# Patient Record
Sex: Female | Born: 1960 | Race: White | Hispanic: No | Smoking: Never smoker
Health system: Southern US, Community
[De-identification: ages and names within clinical notes are randomized; demographics above are authoritative.]

## PROBLEM LIST (undated history)

## (undated) HISTORY — PX: TONSILLECTOMY: SUR1361

## (undated) HISTORY — PX: DILATION AND CURETTAGE OF UTERUS: SHX78

---

## 2006-10-31 ENCOUNTER — Ambulatory Visit: Payer: Self-pay | Admitting: Family Medicine

## 2006-10-31 DIAGNOSIS — F419 Anxiety disorder, unspecified: Secondary | ICD-10-CM | POA: Insufficient documentation

## 2006-10-31 DIAGNOSIS — F411 Generalized anxiety disorder: Secondary | ICD-10-CM

## 2006-11-01 ENCOUNTER — Telehealth (INDEPENDENT_AMBULATORY_CARE_PROVIDER_SITE_OTHER): Payer: Self-pay | Admitting: *Deleted

## 2006-11-03 ENCOUNTER — Encounter: Payer: Self-pay | Admitting: Family Medicine

## 2006-11-06 ENCOUNTER — Ambulatory Visit: Payer: Self-pay | Admitting: Family Medicine

## 2006-11-06 DIAGNOSIS — G4700 Insomnia, unspecified: Secondary | ICD-10-CM | POA: Insufficient documentation

## 2006-11-06 DIAGNOSIS — R42 Dizziness and giddiness: Secondary | ICD-10-CM | POA: Insufficient documentation

## 2006-11-07 ENCOUNTER — Telehealth: Payer: Self-pay | Admitting: Family Medicine

## 2006-11-10 ENCOUNTER — Telehealth: Payer: Self-pay | Admitting: Family Medicine

## 2006-11-16 ENCOUNTER — Ambulatory Visit: Payer: Self-pay | Admitting: Family Medicine

## 2006-11-16 DIAGNOSIS — K219 Gastro-esophageal reflux disease without esophagitis: Secondary | ICD-10-CM | POA: Insufficient documentation

## 2006-11-23 ENCOUNTER — Encounter: Payer: Self-pay | Admitting: Family Medicine

## 2006-11-28 ENCOUNTER — Telehealth: Payer: Self-pay | Admitting: Family Medicine

## 2006-12-13 ENCOUNTER — Telehealth (INDEPENDENT_AMBULATORY_CARE_PROVIDER_SITE_OTHER): Payer: Self-pay | Admitting: *Deleted

## 2007-03-14 ENCOUNTER — Telehealth: Payer: Self-pay | Admitting: Family Medicine

## 2007-04-12 ENCOUNTER — Ambulatory Visit: Payer: Self-pay | Admitting: Family Medicine

## 2007-05-15 ENCOUNTER — Ambulatory Visit: Payer: Self-pay | Admitting: Family Medicine

## 2007-05-15 DIAGNOSIS — M25569 Pain in unspecified knee: Secondary | ICD-10-CM | POA: Insufficient documentation

## 2007-06-19 ENCOUNTER — Ambulatory Visit: Payer: Self-pay | Admitting: Family Medicine

## 2007-08-01 ENCOUNTER — Telehealth: Payer: Self-pay | Admitting: Family Medicine

## 2007-08-21 ENCOUNTER — Ambulatory Visit: Payer: Self-pay | Admitting: Family Medicine

## 2008-05-24 ENCOUNTER — Telehealth: Payer: Self-pay | Admitting: Family Medicine

## 2009-09-03 ENCOUNTER — Ambulatory Visit: Payer: Self-pay | Admitting: Family Medicine

## 2009-09-03 DIAGNOSIS — R0789 Other chest pain: Secondary | ICD-10-CM | POA: Insufficient documentation

## 2009-09-06 LAB — CONVERTED CEMR LAB
ALT: 15 units/L (ref 0–35)
AST: 19 units/L (ref 0–37)
Albumin: 4.5 g/dL (ref 3.5–5.2)
Alkaline Phosphatase: 38 units/L — ABNORMAL LOW (ref 39–117)
Glucose, Bld: 93 mg/dL (ref 70–99)
MCHC: 32.8 g/dL (ref 30.0–36.0)
MCV: 88.4 fL (ref 78.0–100.0)
Platelets: 218 10*3/uL (ref 150–400)
Potassium: 4.4 meq/L (ref 3.5–5.3)
RBC: 4.93 M/uL (ref 3.87–5.11)
RDW: 12.3 % (ref 11.5–15.5)
Sodium: 139 meq/L (ref 135–145)
Total Bilirubin: 0.4 mg/dL (ref 0.3–1.2)
Total Protein: 7.1 g/dL (ref 6.0–8.3)

## 2009-09-09 ENCOUNTER — Telehealth: Payer: Self-pay | Admitting: Family Medicine

## 2009-09-17 ENCOUNTER — Ambulatory Visit: Payer: Self-pay | Admitting: Family Medicine

## 2009-09-17 DIAGNOSIS — K589 Irritable bowel syndrome without diarrhea: Secondary | ICD-10-CM | POA: Insufficient documentation

## 2009-09-17 DIAGNOSIS — K582 Mixed irritable bowel syndrome: Secondary | ICD-10-CM | POA: Insufficient documentation

## 2009-09-18 ENCOUNTER — Telehealth: Payer: Self-pay | Admitting: Family Medicine

## 2009-09-23 ENCOUNTER — Encounter: Payer: Self-pay | Admitting: Family Medicine

## 2009-10-08 ENCOUNTER — Telehealth: Payer: Self-pay | Admitting: Family Medicine

## 2010-03-04 ENCOUNTER — Telehealth: Payer: Self-pay | Admitting: Family Medicine

## 2010-03-25 ENCOUNTER — Encounter: Payer: Self-pay | Admitting: Family Medicine

## 2010-04-06 ENCOUNTER — Ambulatory Visit: Payer: Self-pay | Admitting: Family Medicine

## 2010-06-07 ENCOUNTER — Telehealth: Payer: Self-pay | Admitting: Family Medicine

## 2010-08-05 ENCOUNTER — Encounter: Payer: Self-pay | Admitting: Family Medicine

## 2010-08-05 ENCOUNTER — Ambulatory Visit
Admission: RE | Admit: 2010-08-05 | Discharge: 2010-08-05 | Payer: Self-pay | Source: Home / Self Care | Attending: Family Medicine | Admitting: Family Medicine

## 2010-08-10 NOTE — Progress Notes (Signed)
Summary: Needs dexilant rx  Phone Note Call from Patient Call back at Home Phone [phone]   Caller: Patient Call For: Nani Gasser MD Summary of Call: Pt states dexilant working ok for her. Out of samples. Send to [facility] Initial call taken by: Kathlene November,  September 09, 2009 1:21 PM  Follow-up for Phone Call        Rx Called In Follow-up by: Nani Gasser MD,  September 09, 2009 1:40 PM    New/Updated Medications: DEXILANT 30 MG CPDR (DEXLANSOPRAZOLE) Take 1 tablet by mouth once a day Prescriptions: DEXILANT 30 MG CPDR (DEXLANSOPRAZOLE) Take 1 tablet by mouth once a day  #30 x 2   Entered and Authorized by:   Nani Gasser MD   Signed by:   Nani Gasser MD on 09/09/2009   Method used:   Electronically to        [facility]* (retail)       [address]       [place], [place]  [address]       Ph: [phone]       Fax: [phone]   RxID:   [id number]

## 2010-08-10 NOTE — Assessment & Plan Note (Signed)
Summary: FU GERD, IBS   Vital Signs:  Patient profile:   50 year old female Height:      63 inches Weight:      130 pounds Pulse rate:   85 / minute BP sitting:   91 / 60  (left arm) Cuff size:   regular  Vitals Entered By: Kathlene November (September 17, 2009 9:54 AM) CC: followup IBS- dexilant helped the heartburn but continues to have bloating and gas   Primary Care Provider:  Nani Gasser MD  CC:  followup IBS- dexilant helped the heartburn but continues to have bloating and gas.  History of Present Illness: followup IBS- dexilant helped the heartburn but continues to have bloating and gas.  Used to take probiotics. Occ uses Beano and will occ help.  has a severe episode a few days ago after eating pasta with marina. No vomiting. Has also worked on increasing her fiber for her IBS.   Current Medications (verified): 1)  One-Daily Multivitamins  Tabs (Multiple Vitamin) .... Take 1 Tablet By Mouth Once A Day 2)  Restasis 0.05 % Emul (Cyclosporine) .... Two Drops To Affected Eye Daily 3)  Vitamin C 1000 Mg  Tabs (Ascorbic Acid) .... Take One By Mouth Every Morning 4)  Bl Vitamin B-6 100 Mg  Tabs (Pyridoxine Hcl) .... Take One Tablet By Mouth Once in The Morning 5)  Prochieve 4 %  Gel (Progesterone) .... Apply 1/2 Cc To Forearm From Day 7 Thru 14 Apply 1 Cc From Day 15-26 6)  Vagifem 25 Mcg  Tabs (Estradiol) .... Take One Tablet By Mouth Every Evening X 2 Weeks Then 1 Tablet Twice A Week For Maintenance Dose 7)  Dexilant 30 Mg Cpdr (Dexlansoprazole) .... Take 1 Tablet By Mouth Once A Day 8)  Magnesium Citrate .... Once Daily 9)  Fish Oil Maximum Strength 1200 Mg Caps (Omega-3 Fatty Acids) .... Take 2 Tabs By Mouth Every Morning 10)  Vitamin D 2000 Unit Tabs (Cholecalciferol) .... Take One Tab By Mouth Every Morning  Allergies (verified): 1)  ! * Entex La 2)  ! * Thimerasol 3)  ! * Robitussin Dm Sugarless  Comments:  Nurse/Medical Assistant: The patient's medications and  allergies were reviewed with the patient and were updated in the Medication and Allergy Lists. Kathlene November (September 17, 2009 9:55 AM)  Past History:  Family History: Last updated: 10/31/2006 Mother high chol, diverticulosis father prostate cancer, ALS (died), PUD brother high chol MGF DM MGM stroke  Physical Exam  General:  Well-developed,well-nourished,in no acute distress; alert,appropriate and cooperative throughout examination Lungs:  Normal respiratory effort, chest expands symmetrically. Lungs are clear to auscultation, no crackles or wheezes. Heart:  Normal rate and regular rhythm. S1 and S2 normal without gallop, murmur, click, rub or other extra sounds. Abdomen:  Bowel sounds positive,abdomen soft and non-tender without masses, organomegaly or hernias noted. Slight increased tympany in the upper quadrants.  Skin:  no rashes.   Psych:  Cognition and judgment appear intact. Alert and cooperative with normal attention span and concentration. No apparent delusions, illusions, hallucinations   Impression & Recommendations:  Problem # 1:  GERD (ICD-530.81) Assessment Improved Her CP and GERD are much improved. Call after has been in dexilant for 8-12 weeks and then will wean down to an H2 blocker.   Her updated medication list for this problem includes:    Dexilant 30 Mg Cpdr (Dexlansoprazole) .[name]... Take 1 tablet by mouth once a day  Problem # 2:  IBS (ICD-564.1)  The increase in fiber may be contributing to the bloating and gas but it is helpful for her IBS.  Discussed start a probiotic. Start with Align and give it 3 weeks to improve. If not better then call and will refer to GI for further evaluation.    Complete Medication List: 1)  One-daily Multivitamins Tabs (Multiple vitamin) .... Take 1 tablet by mouth once a day 2)  Restasis 0.05 % Emul (Cyclosporine) .... Two drops to affected eye daily 3)  Vitamin C 1000 Mg Tabs (Ascorbic acid) .... Take one by mouth every morning 4)   Bl Vitamin B-6 100 Mg Tabs (Pyridoxine hcl) .... Take one tablet by mouth once in the morning 5)  Prochieve 4 % Gel (Progesterone) .... Apply 1/2 cc to forearm from day 7 thru 14 apply 1 cc from day 15-26 6)  Vagifem 25 Mcg Tabs (Estradiol) .... Take one tablet by mouth every evening x 2 weeks then 1 tablet twice a week for maintenance dose 7)  Dexilant 30 Mg Cpdr (Dexlansoprazole) .... Take 1 tablet by mouth once a day 8)  Magnesium Citrate  .... Once daily 9)  Fish Oil Maximum Strength 1200 Mg Caps (Omega-3 fatty acids) .... Take 2 tabs by mouth every morning 10)  Vitamin D 2000 Unit Tabs (Cholecalciferol) .... Take one tab by mouth every morning

## 2010-08-10 NOTE — Progress Notes (Signed)
Summary: Update on meds  Phone Note Call from Patient Call back at Home Phone [phone]   Caller: Patient Call For: Nani Gasser MD Summary of Call: Called with update. Doing well on the Dexilant taking one tab every other day.Was told to call back to let you know how she was doing. Initial call taken by: Kathlene November LPN,  June 07, 2010 8:47 AM  Follow-up for Phone Call        Good. Does she need a refill?  Follow-up by: Nani Gasser MD,  June 07, 2010 9:13 AM  Additional Follow-up for Phone Call Additional follow up Details #1::        called and left messge Additional Follow-up by: Avon Gully CMA, Duncan Dull),  June 07, 2010 11:45 AM

## 2010-08-10 NOTE — Letter (Signed)
Summary: [facility]  [facility]   Imported By: Lanelle Bal 04/06/2010 10:46:32  _____________________________________________________________________  External Attachment:    Type:   Image     Comment:   External Document

## 2010-08-10 NOTE — Assessment & Plan Note (Signed)
Summary: IBS   Vital Signs:  Patient profile:   50 year old female Height:      63 inches Weight:      119 pounds BMI:     21.16 O2 Sat:      98 % on Room air Temp:     98.4 degrees F oral Pulse rate:   88 / minute BP sitting:   110 / 79  (left arm) Cuff size:   regular  Vitals Entered By: Payton Spark CMA (April 06, 2010 1:23 PM)  O2 Flow:  Room air CC: F/U digestive issues.   Primary Care Provider:  Nani Gasser MD  CC:  F/U digestive issues..  History of Present Illness: Using the Align and dexilant together and has felt great!!  Made dietary changes as well. Hsn't felt well for years.  Has been better with her fruits and vegetables.  Did have a flare with her heartburn about a week ago.   Current Medications (verified): 1)  One-Daily Multivitamins  Tabs (Multiple Vitamin) .... Take 1 Tablet By Mouth Once A Day 2)  Restasis 0.05 % Emul (Cyclosporine) .... Two Drops To Affected Eye Daily 3)  Vitamin C 1000 Mg  Tabs (Ascorbic Acid) .... Take One By Mouth Every Morning 4)  Bl Vitamin B-6 100 Mg  Tabs (Pyridoxine Hcl) .... Take One Tablet By Mouth Once in The Morning 5)  Prochieve 4 %  Gel (Progesterone) .... Apply 1/2 Cc To Forearm From Day 7 Thru 14 Apply 1 Cc From Day 15-26 6)  Vagifem 25 Mcg  Tabs (Estradiol) .... Take One Tablet By Mouth Every Evening X 2 Weeks Then 1 Tablet Twice A Week For Maintenance Dose 7)  Dexilant 30 Mg Cpdr (Dexlansoprazole) .... Take 1 Tablet By Mouth Once A Day 8)  Magnesium Citrate .... Once Daily 9)  Fish Oil Maximum Strength 1200 Mg Caps (Omega-3 Fatty Acids) .... Take 2 Tabs By Mouth Every Morning 10)  Vitamin D 2000 Unit Tabs (Cholecalciferol) .... Take One Tab By Mouth Every Morning 11)  Align  Caps (Probiotic Product)  Allergies (verified): 1)  ! * Entex La 2)  ! * Thimerasol 3)  ! 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  Married to [place]- in [employer] 4 yo  son. Never smoked.  Working out regularly.   Fair diet. 2 ETOH per week  Physical Exam  General:  Well-developed,well-nourished,in no acute distress; alert,appropriate and cooperative throughout examination Head:  Normocephalic and atraumatic without obvious abnormalities. No apparent alopecia or balding. Abdomen:  Bowel sounds positive,abdomen soft and non-tender without masses, organomegaly or hernias noted. Psych:  Cognition and judgment appear intact. Alert and cooperative with normal attention span and concentration. No apparent delusions, illusions, hallucinations   Impression & Recommendations:  Problem # 1:  IBS (ICD-564.1) Doing very well overall.  Continue the align.  Will dec the align to every other day and then try to wean to a PPI. Realy needs to work on continuing the fruites and vaggies and avoiding greasy, fatty foods.   Complete Medication List: 1)  One-daily Multivitamins Tabs (Multiple vitamin) .... Take 1 tablet by mouth once a day 2)  Restasis 0.05 % Emul (Cyclosporine) .... Two drops to affected eye daily 3)  Vitamin C 1000 Mg Tabs (Ascorbic acid) .... Take one by mouth every morning 4)  Bl Vitamin B-6 100 Mg Tabs (Pyridoxine hcl) .... Take one tablet by mouth once in the morning  5)  Prochieve 4 % Gel (Progesterone) .... Apply 1/2 cc to forearm from day 7 thru 14 apply 1 cc from day 15-26 6)  Vagifem 25 Mcg Tabs (Estradiol) .... Take one tablet by mouth every evening x 2 weeks then 1 tablet twice a week for maintenance dose 7)  Dexilant 30 Mg Cpdr (Dexlansoprazole) .... Take 1 tablet by mouth once a day 8)  Magnesium Citrate  .... Once daily 9)  Fish Oil Maximum Strength 1200 Mg Caps (Omega-3 fatty acids) .... Take 2 tabs by mouth every morning 10)  Vitamin D 2000 Unit Tabs (Cholecalciferol) .... Take one tab by mouth every morning 11)  Align Caps (Probiotic product)  Other Orders: Admin 1st Vaccine (16109) Flu Vaccine 61yrs + (60454) Flu Vaccine Consent  Questions     Do you have a history of severe allergic reactions to this vaccine? no    Any prior history of allergic reactions to egg and/or gelatin? no    Do you have a sensitivity to the preservative Thimersol? no    Do you have a past history of Guillan-Barre Syndrome? no    Do you currently have an acute febrile illness? no    Have you ever had a severe reaction to latex? no    Vaccine information given and explained to patient? yes    Are you currently pregnant? no    Lot Number:[id number]   Exp Date:01/08/2011   Site Given  Left Deltoid IM 10)  Vitamin D 2000 Unit Tabs (Cholecalciferol) .... Take one tab by mouth every morning 11)  Align Caps (Probiotic product)  Other Orders: Admin 1st Vaccine (09811) Flu Vaccine 44yrs + (91478) .lbflu  Patient Instructions: 1)  Drop dexilant to every other day. Your bottle will say daily but really taek it every other day for 2 months and then we can try to wean to ranitidine if still doing well.  Prescriptions: DEXILANT 30 MG CPDR (DEXLANSOPRAZOLE) Take 1 tablet by mouth once a day  #30 x 1   Entered and Authorized by:   Nani Gasser MD   Signed by:   Nani Gasser MD on 04/06/2010   Method used:   Electronically to        [facility] (retail)       [address]       [place], [place]  [address]       Ph: [phone]       Fax: [phone]   RxID:   [id number]

## 2010-08-10 NOTE — Progress Notes (Signed)
Summary: Dexilant  Phone Note Call from Patient Call back at Home Phone [phone]   Caller: Patient Call For: Nani Gasser MD Summary of Call: Pt called to let you know she is doing great on the probiotics and dexilant. Symptom free. Needs a script sent to her pharmacy for the dexilant Initial call taken by: Kathlene November,  October 08, 2009 10:45 AM    Prescriptions: DEXILANT 30 MG CPDR (DEXLANSOPRAZOLE) Take 1 tablet by mouth once a day  #30 x 2   Entered and Authorized by:   Nani Gasser MD   Signed by:   Nani Gasser MD on 10/08/2009   Method used:   Electronically to        [facility] (retail)       [address]       [place], [place]  [address]       Ph: [phone]       Fax: [phone]   RxID:   [id number]

## 2010-08-10 NOTE — Assessment & Plan Note (Signed)
Summary: GERD, chest pain   Vital Signs:  Patient profile:   50 year old female Height:      63 inches Weight:      129.08 pounds BMI:     22.95 Temp:     98.0 degrees F oral Pulse rate:   74 / minute Pulse rhythm:   regular BP sitting:   115 / 75 Cuff size:   regular  Vitals Entered By: Kern Reap CMA Duncan Dull) (September 03, 2009 1:34 PM) CC: IBS and GERD concerns Is Patient Diabetic? No Pain Assessment Patient in pain? no        Primary Care Provider:  Nani Gasser MD  CC:  IBS and GERD concerns.  History of Present Illness: IBS and GERD concerns.  Starting in the fall of 2010 started noticing really soft frequent stools. 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  Now on natural progesterone cream. Felt hormones got out of balance.  Gained about 10 lbs since last fall. Felt very bloated. Having some heartburn.  About 7 days ago woke up with chest pain in the middle of the night under the sternum. Pain last about a minute or two.  Next day had alot of bloating and gas. STarted eating more veggies and fruit.  Has not happened since then.  Will occc have brief chest pain when stressed or rushed.   Allergies: 1)  ! * Entex La 2)  ! * Thimerasol 3)  ! * Robitussin Dm Sugarless  Family History: Reviewed history from 10/31/2006 and no changes required. Mother high chol, diverticulosis father prostate cancer, ALS (died), PUD brother high chol MGF DM MGM stroke  Social History: Reviewed history from 04/12/2007 and no changes required. Background in psychology.  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  Married to [place]- in [employer] 4 yo son. Never smoked.  Starting to exercise. Fair diet. 2 ETOH per week  Physical Exam  General:  Well-developed,well-nourished,in no acute distress; alert,appropriate and cooperative throughout examination Head:  Normocephalic and atraumatic without obvious  abnormalities. No apparent alopecia or balding. Eyes:  No corneal or conjunctival inflammation noted. EOMI. Perrla.  Mouth:  Oral mucosa and oropharynx without lesions or exudates.  Teeth in good repair. Neck:  No deformities, masses, or tenderness noted. Lungs:  Normal respiratory effort, chest expands symmetrically. Lungs are clear to auscultation, no crackles or wheezes. Heart:  Normal rate and regular rhythm. S1 and S2 normal without gallop, murmur, click, rub or other extra sounds. No caroit or abdominal bruits.  Abdomen:  Bowel sounds positive,abdomen soft and non-tender without masses, organomegaly or hernias noted. Pulses:  Radial 2+ bilat.  Extremities:  NO LE edema.  Neurologic:  No cranial nerve deficits noted. Station and gait are normal. Skin:  no rashes.   Cervical Nodes:  No lymphadenopathy noted Psych:  Cognition and judgment appear intact. Alert and cooperative with normal attention span and concentration. No apparent delusions, illusions, hallucinations   Impression & Recommendations:  Problem # 1:  GERD (ICD-530.81) Assessment Deteriorated  I really think her sxs are from GERD.  Also consider gluten intolerance though I really think this is from her increased stress and poor dietary changes. EKG show NSR with rate of 71pbm. Reassured her this was not her heart (she was mose worried about this). She is already on a PPI so given samples of Dexilant to try for one week. If not better  then will refer to GI for further evaluation. Also consider stress as a trigger. In meantime will get labs to rule out any liver, thyroid or gluten intolerance. Did check pancreatic enzymes but can add if needed. Her exam was relatively bengin.  The following medications were removed from the medication list:    Magnesium Oxide 250 Mg Tabs (Magnesium oxide) .[name]... Take 1 tablet by mouth twice a day    Prilosec 20 Mg Cpdr (Omeprazole) .[name]... Take one tablwet by mouth once a day Her updated medication  list for this problem includes:    Protonix 40 Mg Pack (Pantoprazole sodium) .[name]... Take 1 tablet by mouth once a day  Orders: T-Comprehensive Metabolic Panel ([id number]) T-CBC No Diff ([id number]) T-TSH ([id number]) T- * Misc. Laboratory test [phone]) T-Gliadin Peptide Antibodies, IgG, LgA ([id number])  I really think her sxs are from GERD.  Also consider gluten intolerance though I really think this is from her increased stress and poor dietary changes.  The following medications were removed from the medication list:    Magnesium Oxide 250 Mg Tabs (Magnesium oxide) .[name]... Take 1 tablet by mouth twice a day    Prilosec 20 Mg Cpdr (Omeprazole) .[name]... Take one tablwet by mouth once a day Her updated medication list for this problem includes:    Protonix 40 Mg Pack (Pantoprazole sodium) .[name]... Take 1 tablet by mouth once a day  Problem # 2:  CHEST PAIN, ATYPICAL (ICD-786.59) Assessment: New I do think this is GERD relate.d  EKG show NSR with rate of 71pbm. Reassured her this was not her heart (she was mose worried about this)  Complete Medication List: 1)  One-daily Multivitamins Tabs (Multiple vitamin) .... Take 1 tablet by mouth once a day 2)  Restasis 0.05 % Emul (Cyclosporine) .... Two drops to affected eye daily 3)  Vitamin C 1000 Mg Tabs (Ascorbic acid) .... Take one by mouth every morning 4)  Bl Vitamin B-6 100 Mg Tabs (Pyridoxine hcl) .... Take one tablet by mouth once in the morning 5)  Prochieve 4 % Gel (Progesterone) .... Apply 1/2 cc to forearm from day 7 thru 14 apply 1 cc from day 15-26 6)  Vagifem 25 Mcg Tabs (Estradiol) .... Take one tablet by mouth every evening x 2 weeks then 1 tablet twice a week for maintenance dose 7)  Protonix 40 Mg Pack (Pantoprazole sodium) .... Take 1 tablet by mouth once a day 8)  Magnesium Citrate  .... Once daily 9)  Fish Oil Maximum Strength 1200 Mg Caps (Omega-3 fatty acids) .... Take 2 tabs by mouth every morning 10)  Vitamin D 2000 Unit  Tabs (Cholecalciferol) .... Take one tab by mouth every morning  Other Orders: EKG w/ Interpretation (93000)

## 2010-08-10 NOTE — Progress Notes (Signed)
Summary: Sxs worsening  Phone Note Call from Patient   Caller: Patient Summary of Call: Pt calls c/o LLQ pain this AM that is now gone. Then she had upper quad cramping that later turned into a gas feeling. I asked Pt if she tried gas relief OTC and she said she wanted the doctors opinion. I also advised Pt that if she was having that much discomfort she should go to an UC. Pt got very defensive and insisted that I consult the doctor before telling her what to do. Please advise.  Initial call taken by: Payton Spark CMA,  September 18, 2009 4:19 PM  Follow-up for Phone Call        if severe, go to UC o/w can f/u with Dr Judie Petit next wk. Follow-up by: Seymour Bars DO,  September 18, 2009 4:24 PM     Appended Document: Sxs worsening Pt aware

## 2010-08-10 NOTE — Progress Notes (Signed)
Summary: meds  Phone Note Call from Patient   Caller: Patient Call For: Kathleen Gasser MD Summary of Call: Pt called to let you know the Dexilant and Align are working well.Didnt know if she needed to f/u in office or not Initial call taken by: Avon Gully CMA, Duncan Dull),  March 04, 2010 11:55 AM  Follow-up for Phone Call        F/u in one month.  Follow-up by: Kathleen Gasser MD,  March 04, 2010 12:04 PM  Additional Follow-up for Phone Call Additional follow up Details #1::        pt notified Additional Follow-up by: Avon Gully CMA, Duncan Dull),  March 04, 2010 12:09 PM

## 2010-08-10 NOTE — Letter (Signed)
Summary: [facility]  [facility]   Imported By: Lanelle Bal 11/05/2009 09:33:34  _____________________________________________________________________  External Attachment:    Type:   Image     Comment:   External Document

## 2010-08-12 NOTE — Assessment & Plan Note (Signed)
Summary: GERD, chest pain   Vital Signs:  Patient profile:   50 year old female Height:      63 inches Weight:      120 pounds Pulse rate:   76 / minute BP sitting:   104 / 67  (right arm) Cuff size:   regular  Vitals Entered By: Avon Gully CMA, Duncan Dull) (August 05, 2010 1:12 PM) CC: heartburn? takes dexilant qod   Primary Care Provider:  Nani Gasser MD  CC:  heartburn? takes dexilant qod.  History of Present Illness: heartburn? takes dexilant every other day.  Has been on the Align daily.  Has been on the dexilant eveyr other day for about 4 months and that has worked really well until about a month ago.  Had an episode of chest burning, achiness,a dn felt had to catch her breath.  Happned while driving in the car. Has had a few more episodes since then.  Occ burning and discomfort in the epigastric area. Usually in teh afternoon and evening.   Current Medications (verified): 1)  One-Daily Multivitamins  Tabs (Multiple Vitamin) .... Take 1 Tablet By Mouth Once A Day 2)  Restasis 0.05 % Emul (Cyclosporine) .... Two Drops To Affected Eye Daily 3)  Vitamin C 1000 Mg  Tabs (Ascorbic Acid) .... Take One By Mouth Every Morning 4)  Bl Vitamin B-6 100 Mg  Tabs (Pyridoxine Hcl) .... Take One Tablet By Mouth Once in The Morning 5)  Prochieve 4 %  Gel (Progesterone) .... Apply 1/2 Cc To Forearm From Day 7 Thru 14 Apply 1 Cc From Day 15-26 6)  Dexilant 30 Mg Cpdr (Dexlansoprazole) .... Take 1 Tablet By Mouth Once A Day 7)  Magnesium Citrate .... Once Daily 8)  Fish Oil Maximum Strength 1200 Mg Caps (Omega-3 Fatty Acids) .... Take 2 Tabs By Mouth Every Morning 9)  Vitamin D 2000 Unit Tabs (Cholecalciferol) .... Take One Tab By Mouth Every Morning 10)  Align  Caps (Probiotic Product)  Allergies (verified): 1)  ! * Entex La 2)  ! * Thimerasol 3)  ! * Robitussin Dm Sugarless  Comments:  Nurse/Medical Assistant: The patient's medications and allergies were reviewed with the  patient and were updated in the Medication and Allergy Lists. Avon Gully CMA, Duncan Dull) (August 05, 2010 1:13 PM)  Physical Exam  General:  Well-developed,well-nourished,in no acute distress; alert,appropriate and cooperative throughout examination Chest Wall:  No deformities, masses, or tenderness noted. Lungs:  Normal respiratory effort, chest expands symmetrically. Lungs are clear to auscultation, no crackles or wheezes. Heart:  Normal rate and regular rhythm. S1 and S2 normal without gallop, murmur, click, rub or other extra sounds. Abdomen:  Bowel sounds positive,abdomen soft and non-tender without masses, organomegaly or hernias noted. Skin:  no rashes.   Psych:  Cognition and judgment appear intact. Alert and cooperative with normal attention span and concentration. No apparent delusions, illusions, hallucinations   Impression & Recommendations:  Problem # 1:  GERD (ICD-530.81) Assessment Deteriorated I really do think her sxs are GERD or stress related. Each episode happened in the care while running late etc but she also has extensive hx of GI problems. In dexilant to once a day for 2 weeks, If not better then will refer to GI for further evaluation. She was very concerned about her heart so I agreed to a EKG to giver her some peice of mind. Continue the align once a day.  EKG shows NSR, 70 bpm,  no acute changes.  Her  updated medication list for this problem includes:    Dexilant 30 Mg Cpdr (Dexlansoprazole) .[name]... Take 1 tablet by mouth once a day  Complete Medication List: 1)  One-daily Multivitamins Tabs (Multiple vitamin) .... Take 1 tablet by mouth once a day 2)  Restasis 0.05 % Emul (Cyclosporine) .... Two drops to affected eye daily 3)  Vitamin C 1000 Mg Tabs (Ascorbic acid) .... Take one by mouth every morning 4)  Bl Vitamin B-6 100 Mg Tabs (Pyridoxine hcl) .... Take one tablet by mouth once in the morning 5)  Prochieve 4 % Gel (Progesterone) .... Apply 1/2 cc to  forearm from day 7 thru 14 apply 1 cc from day 15-26 6)  Dexilant 30 Mg Cpdr (Dexlansoprazole) .... Take 1 tablet by mouth once a day 7)  Magnesium Citrate  .... Once daily 8)  Fish Oil Maximum Strength 1200 Mg Caps (Omega-3 fatty acids) .... Take 2 tabs by mouth every morning 9)  Vitamin D 2000 Unit Tabs (Cholecalciferol) .... Take one tab by mouth every morning 10)  Align Caps (Probiotic product) .... Once a day.  Other Orders: EKG w/ Interpretation (93000)  Patient Instructions: 1)  Increase your dexilant to once a day.  2)  Call if you are not better in 2 weeks. If not better then I will refer you to GI for further evaluation.    Orders Added: 1)  Est. Patient Level IV [62130] 2)  EKG w/ Interpretation [93000]

## 2010-08-19 ENCOUNTER — Telehealth: Payer: Self-pay | Admitting: Family Medicine

## 2010-08-26 NOTE — Progress Notes (Signed)
Summary: Dexilant   Phone Note Call from Patient Call back at [facility] Phone [phone]   Caller: Patient Summary of Call: Pt said to let you know she is doing well on  daily dose of Dexilant Initial call taken by: Lannette Donath,  August 19, 2010 11:42 AM

## 2010-10-04 ENCOUNTER — Other Ambulatory Visit: Payer: Self-pay | Admitting: Family Medicine

## 2010-10-04 DIAGNOSIS — K219 Gastro-esophageal reflux disease without esophagitis: Secondary | ICD-10-CM

## 2011-01-11 ENCOUNTER — Other Ambulatory Visit: Payer: Self-pay | Admitting: Family Medicine

## 2011-05-16 ENCOUNTER — Other Ambulatory Visit: Payer: Self-pay | Admitting: Family Medicine

## 2011-05-25 ENCOUNTER — Other Ambulatory Visit: Payer: Self-pay | Admitting: Family Medicine

## 2011-06-14 ENCOUNTER — Ambulatory Visit (INDEPENDENT_AMBULATORY_CARE_PROVIDER_SITE_OTHER): Payer: Self-pay | Admitting: Family Medicine

## 2011-06-14 DIAGNOSIS — Z23 Encounter for immunization: Secondary | ICD-10-CM

## 2011-06-14 NOTE — Progress Notes (Signed)
Here for flu vac  

## 2011-09-15 ENCOUNTER — Other Ambulatory Visit: Payer: Self-pay | Admitting: Family Medicine

## 2011-09-15 NOTE — Telephone Encounter (Signed)
Needs appointment

## 2011-10-19 ENCOUNTER — Telehealth: Payer: Self-pay | Admitting: *Deleted

## 2011-10-19 NOTE — Telephone Encounter (Signed)
OK, updated med list

## 2011-10-19 NOTE — Telephone Encounter (Signed)
Pt weaned herself off the Dexilant and is taking Align daily now and this is working well. FYI

## 2011-11-18 LAB — HM PAP SMEAR: HM Pap smear: NEGATIVE

## 2011-12-27 ENCOUNTER — Ambulatory Visit (INDEPENDENT_AMBULATORY_CARE_PROVIDER_SITE_OTHER): Payer: Managed Care, Other (non HMO) | Admitting: Family Medicine

## 2011-12-27 VITALS — BP 93/62 | HR 85

## 2011-12-27 DIAGNOSIS — N390 Urinary tract infection, site not specified: Secondary | ICD-10-CM

## 2011-12-27 DIAGNOSIS — R300 Dysuria: Secondary | ICD-10-CM

## 2011-12-27 LAB — POCT URINALYSIS DIPSTICK
Bilirubin, UA: NEGATIVE
Glucose, UA: NEGATIVE
Ketones, UA: NEGATIVE
Nitrite, UA: NEGATIVE

## 2011-12-27 MED ORDER — CIPROFLOXACIN HCL 500 MG PO TABS: 500.0000 mg | ORAL_TABLET | Freq: Two times a day (BID) | ORAL | Status: AC

## 2011-12-27 NOTE — Progress Notes (Addendum)
  Subjective:    Patient ID: Kathleen Beck, female    DOB: [birth year], 51 y.o.   MRN: [id number] Painful urination with cloudy urine a few days ago. HPI    Review of Systems     Objective:   Physical Exam        Assessment & Plan:  UTI- no fever.  Will tx with cipro. Call if sxs don't resolve.   Cipriano Bunker, MD.

## 2012-04-30 ENCOUNTER — Ambulatory Visit: Payer: Managed Care, Other (non HMO)

## 2012-05-02 ENCOUNTER — Ambulatory Visit (INDEPENDENT_AMBULATORY_CARE_PROVIDER_SITE_OTHER): Payer: Managed Care, Other (non HMO) | Admitting: Physician Assistant

## 2012-05-02 DIAGNOSIS — Z23 Encounter for immunization: Secondary | ICD-10-CM

## 2013-04-17 ENCOUNTER — Ambulatory Visit (INDEPENDENT_AMBULATORY_CARE_PROVIDER_SITE_OTHER): Payer: BC Managed Care – PPO | Admitting: Family Medicine

## 2013-04-17 DIAGNOSIS — Z23 Encounter for immunization: Secondary | ICD-10-CM

## 2013-04-17 NOTE — Progress Notes (Signed)
  Subjective:    Patient ID: Kathleen Beck, female    DOB: 1960-09-28, 52 y.o.   MRN: [id number]  HPIhere for flu inj   Review of Systems     Objective:   Physical Exam        Assessment & Plan:

## 2014-01-24 ENCOUNTER — Encounter: Payer: Self-pay | Admitting: Emergency Medicine

## 2014-01-24 ENCOUNTER — Emergency Department (INDEPENDENT_AMBULATORY_CARE_PROVIDER_SITE_OTHER)
Admission: EM | Admit: 2014-01-24 | Discharge: 2014-01-24 | Disposition: A | Discharge status: Home / Self Care | Payer: BC Managed Care – PPO | Source: Home / Self Care | Attending: Emergency Medicine | Admitting: Emergency Medicine

## 2014-01-24 DIAGNOSIS — R319 Hematuria, unspecified: Secondary | ICD-10-CM

## 2014-01-24 DIAGNOSIS — N3000 Acute cystitis without hematuria: Secondary | ICD-10-CM

## 2014-01-24 DIAGNOSIS — N3001 Acute cystitis with hematuria: Secondary | ICD-10-CM

## 2014-01-24 DIAGNOSIS — R350 Frequency of micturition: Secondary | ICD-10-CM

## 2014-01-24 LAB — POCT URINALYSIS DIP (MANUAL ENTRY)
Bilirubin, UA: NEGATIVE
Glucose, UA: NEGATIVE
Ketones, POC UA: NEGATIVE
Nitrite, UA: NEGATIVE
Protein Ur, POC: NEGATIVE
Spec Grav, UA: 1.02 (ref 1.005–1.03)
Urobilinogen, UA: 0.2 (ref 0–1)
pH, UA: 7.5 (ref 5–8)

## 2014-01-24 MED ORDER — CIPROFLOXACIN HCL 500 MG PO TABS: 500.0000 mg | ORAL_TABLET | Freq: Two times a day (BID) | ORAL | Status: DC

## 2014-01-24 NOTE — ED Notes (Signed)
Kathleen Beck complains of nausea, urinary frequency and hematuria for 2 days. Denies fever, chills or sweats.

## 2014-01-24 NOTE — ED Provider Notes (Signed)
CSN: [id number]     Arrival date & time 01/24/14  1439 History   First MD Initiated Contact with Patient 01/24/14 1456     Chief Complaint  Patient presents with  . Urinary Frequency  . Hematuria    HPI This is a 53 y.o. female who presents today with UTI symptoms for 2 days.  + dysuria + frequency + urgency scant hematuria No vaginal discharge No fever/chills No lower abdominal pain Minimal nausea No vomiting No back pain No fatigue She denies chance of pregnancy. Has tried over-the-counter measures without improvement.    History reviewed. No pertinent past medical history. Past Surgical History  Procedure Laterality Date  . Tonsillectomy    . Dilation and curettage of uterus    . Cesarean section     Family History  Problem Relation Age of Onset  . Hyperlipidemia Mother   . Cancer Father     prostate  . ALS Father   . Hyperlipidemia Other    History  Substance Use Topics  . Smoking status: Never Smoker   . Smokeless tobacco: Never Used  . Alcohol Use: Yes   OB History   Grav Para Term Preterm Abortions TAB SAB Ect Mult Living                 Review of Systems  All other systems reviewed and are negative.   Allergies  Entex la  Home Medications   Prior to Admission medications   Medication Sig Start Date End Date Taking? Authorizing Provider  Melatonin 5 MG TABS Take 5 mg by mouth at bedtime as needed.   Yes Historical Provider, MD  progesterone (PROMETRIUM) 100 MG capsule Take 100 mg by mouth daily.   Yes Historical Provider, MD  ciprofloxacin (CIPRO) 500 MG tablet Take 1 tablet (500 mg total) by mouth 2 (two) times daily. For 10 days 01/24/14   Lajean Manesavid Massey, MD  Probiotic Product (ALIGN) 4 MG CAPS Take 1 capsule by mouth daily.    Historical Provider, MD   BP 102/71  Pulse 85  Temp(Src) 98.5 F (36.9 C) (Oral)  Ht 5\' 3"  (1.6 m)  Wt 112 lb (50.803 kg)  BMI 19.84 kg/m2  SpO2 100% Physical Exam  Nursing note and vitals  reviewed. Constitutional: She is oriented to person, place, and time. She appears well-developed and well-nourished. No distress.  HENT:  Mouth/Throat: Oropharynx is clear and moist.  Eyes: No scleral icterus.  Neck: Neck supple.  Cardiovascular: Normal rate, regular rhythm and normal heart sounds.   Pulmonary/Chest: Breath sounds normal.  Abdominal: Soft. She exhibits no mass. There is no hepatosplenomegaly. There is tenderness in the suprapubic area. There is no rebound, no guarding and no CVA tenderness.  Lymphadenopathy:    She has no cervical adenopathy.  Neurological: She is alert and oriented to person, place, and time.  Skin: Skin is warm and dry.    ED Course  Procedures (including critical care time) Labs Review Labs Reviewed  POCT URINALYSIS DIP (MANUAL ENTRY) - Abnormal; Notable for the following:   URINE CULTURE   Results for orders placed during the hospital encounter of 01/24/14  POCT URINALYSIS DIP (MANUAL ENTRY)      Result Value Ref Range   Color, UA light yellow     Clarity, UA cloudy     Glucose, UA neg     Bilirubin, UA negative     Bilirubin, UA negative     Spec Grav, UA 1.020  1.005 - 1.03  Blood, UA trace-lysed     pH, UA 7.5  5 - 8   Protein Ur, POC negative     Urobilinogen, UA 0.2  0 - 1   Nitrite, UA Negative     Leukocytes, UA small (1+)      Imaging Review No results found.   MDM   1. Acute cystitis with hematuria   2. Hematuria   3. Frequent urination    Urinalysis positive for trace blood and 1+ leukocytes. Treatment options discussed, as well as risks, benefits, alternatives. Patient voiced understanding and agreement with the following plans: Cipro 500 twice a day x7-10 days Push fluids and other symptomatic care Urine culture sent She declined prescription for the Azo, and I gave her over-the-counter option for Azo. Over 25 minutes spent, greater than 50% of the time spent for counseling and coordination of care. Follow-up  with your primary care doctor in 5-7 days if not improving, or sooner if symptoms become worse. Precautions discussed. Red flags discussed. Questions invited and answered. Patient voiced understanding and agreement.      Lajean Manes, MD 01/24/14 [phone]

## 2014-01-26 LAB — URINE CULTURE
Colony Count: NO GROWTH
Organism ID, Bacteria: NO GROWTH

## 2014-01-27 ENCOUNTER — Telehealth: Payer: Self-pay | Admitting: Emergency Medicine

## 2014-04-22 ENCOUNTER — Ambulatory Visit (INDEPENDENT_AMBULATORY_CARE_PROVIDER_SITE_OTHER): Payer: BC Managed Care – PPO | Admitting: Family Medicine

## 2014-04-22 ENCOUNTER — Telehealth: Payer: Self-pay | Admitting: *Deleted

## 2014-04-22 ENCOUNTER — Encounter: Payer: Self-pay | Admitting: Family Medicine

## 2014-04-22 VITALS — BP 99/64 | HR 91 | Ht 63.0 in | Wt 120.0 lb

## 2014-04-22 DIAGNOSIS — T50B95D Adverse effect of other viral vaccines, subsequent encounter: Secondary | ICD-10-CM

## 2014-04-22 DIAGNOSIS — T50B95A Adverse effect of other viral vaccines, initial encounter: Secondary | ICD-10-CM | POA: Diagnosis not present

## 2014-04-22 NOTE — Progress Notes (Signed)
   Subjective:    Patient ID: Kathleen Beck, female    DOB: 08/07/1960, 53 y.o.   MRN: [id number]  HPI [facility]. Then around 8 PM started to feel some tightness in her chest.  Started to fee SOb and dizzy. Then went to the ED. Developed a ST and cough. Her eye bc red and very itchey.  Given steroid and benadyl and started to get relief before went home. No prior hx of egg allergy.    [facility] [place]  Review of Systems     Objective:   Physical Exam  Constitutional: She is oriented to person, place, and time. She appears well-developed and well-nourished.  HENT:  Head: Normocephalic and atraumatic.  Cardiovascular: Normal rate, regular rhythm and normal heart sounds.   Pulmonary/Chest: Effort normal and breath sounds normal.  Neurological: She is alert and oriented to person, place, and time.  Skin: Skin is warm and dry.  Psychiatric: She has a normal mood and affect. Her behavior is normal.          Assessment & Plan:   Reaction to flu vaccine-she has a known reaction to thimerosal. Based on the paperwork she provided it looks like she was given a preservative-free flu shot. It is unclear if there are vaccine might have been mistakenly given or if she truly had a reaction to the flu shot itself. She has no prior history of egg allergy. Her now we can discuss next are whether not to get the vaccine. Offered to refer her to allergist but she declined at this point in time. Overall she is significantly improved and is on her steroid taper. She has 2 more days left. She is off any antihistamines and doing well. Will complete a vaccine adverse event report for the CC. Discussed risk and benefits of having the flu vaccine.

## 2014-04-23 ENCOUNTER — Ambulatory Visit: Payer: BC Managed Care – PPO | Admitting: Family Medicine

## 2014-04-23 NOTE — Telephone Encounter (Signed)
Encounter for care everywhere.Loralee PacasBarkley, Flay Ghosh [place]

## 2014-06-13 ENCOUNTER — Encounter: Payer: Self-pay | Admitting: Family Medicine

## 2014-06-13 ENCOUNTER — Ambulatory Visit (INDEPENDENT_AMBULATORY_CARE_PROVIDER_SITE_OTHER): Payer: BC Managed Care – PPO | Admitting: Family Medicine

## 2014-06-13 VITALS — BP 102/69 | HR 94 | Temp 97.1°F | Wt 122.0 lb

## 2014-06-13 DIAGNOSIS — K921 Melena: Secondary | ICD-10-CM

## 2014-06-13 DIAGNOSIS — Z1211 Encounter for screening for malignant neoplasm of colon: Secondary | ICD-10-CM | POA: Diagnosis not present

## 2014-06-13 MED ORDER — HYDROCORTISONE ACE-PRAMOXINE 1-1 % RE CREA: 1.0000 "application " | TOPICAL_CREAM | Freq: Two times a day (BID) | RECTAL | Status: DC

## 2014-06-13 NOTE — Patient Instructions (Signed)
Docusate capsules What is this medicine? DOCUSATE (doc CUE sayt) is stool softener. It helps prevent constipation and straining or discomfort associated with hard or dry stools. This medicine may be used for other purposes; ask your health care provider or pharmacist if you have questions. COMMON BRAND NAME(S): Colace, Colace Clear, Correctol, D.O.S., DC, Doc-Q-Lace, DocuLace, Docusoft S, DOK, Dulcolax, Genasoft, Kao-Tin, Kaopectate Liqui-Gels, Phillips Stool Softener, Stool Softener, Stool Softner DC, Sulfolax, Sur-Q-Lax, Surfak, Uni-Ease What should I tell my health care provider before I take this medicine? They need to know if you have any of these conditions: -nausea or vomiting -severe constipation -stomach pain -sudden change in bowel habit lasting more than 2 weeks -an unusual or allergic reaction to docusate, other medicines, foods, dyes, or preservatives -pregnant or trying to get pregnant -breast-feeding How should I use this medicine? Take this medicine by mouth with a glass of water. Follow the directions on the label. Take your doses at regular intervals. Do not take your medicine more often than directed. Talk to your pediatrician regarding the use of this medicine in children. While this medicine may be prescribed for children as young as 2 years for selected conditions, precautions do apply. Overdosage: If you think you have taken too much of this medicine contact a poison control center or emergency room at once. NOTE: This medicine is only for you. Do not share this medicine with others. What if I miss a dose? If you miss a dose, take it as soon as you can. If it is almost time for your next dose, take only that dose. Do not take double or extra doses. What may interact with this medicine? -mineral oil This list may not describe all possible interactions. Give your health care provider a list of all the medicines, herbs, non-prescription drugs, or dietary supplements you  use. Also tell them if you smoke, drink alcohol, or use illegal drugs. Some items may interact with your medicine. What should I watch for while using this medicine? Do not use for more than one week without advice from your doctor or health care professional. If your constipation returns, check with your doctor or health care professional. Drink plenty of water while taking this medicine. Drinking water helps decrease constipation. Stop using this medicine and contact your doctor or health care professional if you experience any rectal bleeding or do not have a bowel movement after use. These could be signs of a more serious condition. What side effects may I notice from receiving this medicine? Side effects that you should report to your doctor or health care professional as soon as possible: -allergic reactions like skin rash, itching or hives, swelling of the face, lips, or tongue Side effects that usually do not require medical attention (report to your doctor or health care professional if they continue or are bothersome): -diarrhea -stomach cramps -throat irritation This list may not describe all possible side effects. Call your doctor for medical advice about side effects. You may report side effects to FDA at 1-800-FDA-1088. Where should I keep my medicine? Keep out of the reach of children. Store at room temperature between 15 and 30 degrees C (59 and 86 degrees F). Throw away any unused medicine after the expiration date. NOTE: This sheet is a summary. It may not cover all possible information. If you have questions about this medicine, talk to your doctor, pharmacist, or health care provider.  2015, Elsevier/Gold Standard. (2007-10-18 15:56:49)  

## 2014-06-13 NOTE — Progress Notes (Signed)
   Subjective:    Patient ID: Kathleen Beck, female    DOB: January 05, 1961, 53 y.o.   MRN: [id number]  HPI On 05/30/14 she felt gassy and noticed trace amount of blood on the toilet tissue.  Then noticed some irritation/burning and thought maybe it is hemrrhoids. The passed a "clear sac with blood". Started using some hemorrhoid cream (Preperation H).  Has never passed something like that before. No travel outside of the county. Not drinking creek water, etc.  No diarrhea. Has been more stressed and constipated.  No usual weight loss or fever.  No blood since then.   She has never had a screening colonoscopy and doesn't really want one.     Review of Systems     Objective:   Physical Exam  Constitutional: She is oriented to person, place, and time. She appears well-developed and well-nourished.  HENT:  Head: Normocephalic and atraumatic.  Genitourinary:  Normal sphincter tone. No palpable masses. With the anoscope I did notice an area of irritation. No distinct ulcerations or tears.  Neurological: She is alert and oriented to person, place, and time.  Skin: Skin is warm and dry.  Psychiatric: She has a normal mood and affect. Her behavior is normal.          Assessment & Plan:  Blood in stool - most likely from rectal irritation. recommend stool softener since has had more hard stools.  If she notices blood again and please let me know. She's also overdue for colon cancer screening.  Colon cancer screening - discussed options. She's not interested in doing a full colonoscopy at this time. She is willing to do stool cards. Will given her stool cards. Recommend wait 2 weeks to start this

## 2014-06-17 ENCOUNTER — Encounter: Payer: Self-pay | Admitting: Family Medicine

## 2014-07-02 LAB — POC HEMOCCULT BLD/STL (HOME/3-CARD/SCREEN)
Card #3 Fecal Occult Blood, POC: NEGATIVE
FECAL OCCULT BLD: NEGATIVE
Fecal Occult Blood, POC: NEGATIVE

## 2014-07-02 NOTE — Addendum Note (Signed)
Addended by: Chalmers CaterUTTLE, Seniah Lawrence H on: 07/02/2014 08:59 AM   Modules accepted: Orders

## 2014-12-01 LAB — HM MAMMOGRAPHY

## 2014-12-09 ENCOUNTER — Telehealth: Payer: Self-pay | Admitting: *Deleted

## 2014-12-09 NOTE — Telephone Encounter (Signed)
Pt left vm this morning stating that she has severe mosquito bites all over her torso and her ankles.  They have small white centers & she stated that she was unable to sleep last night due to the itching. She wanted to know what the best possible cream/ointment is that she can get.  Please advise.

## 2014-12-09 NOTE — Telephone Encounter (Signed)
Hydrocortisone twice a day. Cool compresses. Avoid scratching at it triggers even more itching.

## 2014-12-10 ENCOUNTER — Emergency Department (INDEPENDENT_AMBULATORY_CARE_PROVIDER_SITE_OTHER)
Admission: EM | Admit: 2014-12-10 | Discharge: 2014-12-10 | Disposition: A | Discharge status: Home / Self Care | Payer: BLUE CROSS/BLUE SHIELD | Source: Home / Self Care | Attending: Emergency Medicine | Admitting: Emergency Medicine

## 2014-12-10 ENCOUNTER — Encounter: Payer: Self-pay | Admitting: *Deleted

## 2014-12-10 DIAGNOSIS — W57XXXA Bitten or stung by nonvenomous insect and other nonvenomous arthropods, initial encounter: Secondary | ICD-10-CM | POA: Diagnosis not present

## 2014-12-10 DIAGNOSIS — T148 Other injury of unspecified body region: Secondary | ICD-10-CM

## 2014-12-10 DIAGNOSIS — L298 Other pruritus: Secondary | ICD-10-CM

## 2014-12-10 MED ORDER — METHYLPREDNISOLONE SODIUM SUCC 125 MG IJ SOLR
125.0000 mg | INTRAMUSCULAR | Status: AC
  Administered 2014-12-10: 125 mg via INTRAMUSCULAR

## 2014-12-10 MED ORDER — HYDROXYZINE HCL 10 MG PO TABS: 10.0000 mg | ORAL_TABLET | ORAL | Status: DC | PRN

## 2014-12-10 NOTE — ED Provider Notes (Addendum)
CSN: [id number]     Arrival date & time 12/10/14  0902 History   First MD Initiated Contact with Patient 12/10/14 [phone]     Chief Complaint  Patient presents with  . Insect Bite    HPI Pt presents with multiple mosquito bites throughout body x 2-3 days. C/o severe itching and burning, throughout the day, worse at night. she is otherwise asymptomatic.  Used cortisone cream and tylenol otc with some mild relief.  Denies lip swelling, dysphasia, wheezing, or cardiorespiratory symptoms. No fever or chills or nausea or vomiting. Denies joint swelling or pain. No lightheadedness or syncope or focal neurologic symptoms. History reviewed. No pertinent past medical history. Past Surgical History  Procedure Laterality Date  . Tonsillectomy    . Dilation and curettage of uterus    . Cesarean section     Family History  Problem Relation Age of Onset  . Hyperlipidemia Mother   . Cancer Father     prostate  . ALS Father   . Hyperlipidemia Other    History  Substance Use Topics  . Smoking status: Never Smoker   . Smokeless tobacco: Never Used  . Alcohol Use: Yes   OB History    No data available     Review of Systems  All other systems reviewed and are negative. Remainder of Review of Systems negative for acute change except as noted in the HPI.   Allergies  Entex la; Robitussin (alcohol free); and Thimerosal  Home Medications   Prior to Admission medications   Medication Sig Start Date End Date Taking? Authorizing Provider  Fish Oil-Cholecalciferol (FISH OIL + D3 PO) Take by mouth.   Yes Historical Provider, MD  Melatonin 5 MG TABS Take 5 mg by mouth at bedtime as needed.   Yes Historical Provider, MD  Multiple Vitamin (MULTIVITAMIN) tablet Take 1 tablet by mouth daily.   Yes Historical Provider, MD  progesterone (PROMETRIUM) 100 MG capsule Take 200 mg by mouth daily.    Yes Historical Provider, MD  hydrOXYzine (ATARAX/VISTARIL) 10 MG tablet Take 1 tablet (10 mg total) by mouth  every 4 (four) hours as needed for itching. May take 2 at bedtime. Caution: May cause drowsiness 12/10/14   Lajean Manes, MD  Probiotic Product (ALIGN) 4 MG CAPS Take 1 capsule by mouth daily.    Historical Provider, MD   BP 100/68 mmHg  Pulse 81  Temp(Src) 98.4 F (36.9 C) (Oral)  Resp 14  Wt 115 lb (52.164 kg)  SpO2 100% Physical Exam  Constitutional: She is oriented to person, place, and time. She appears well-developed and well-nourished. No distress.  Pleasant, cooperative female. She appears anxious and uncomfortable from pruritic insect bites  HENT:  Head: Normocephalic and atraumatic.  Mouth/Throat: Oropharynx is clear and moist. No oropharyngeal exudate.  No lip swelling. Oropharynx: Airway intact  Eyes: Conjunctivae and EOM are normal. Pupils are equal, round, and reactive to light. No scleral icterus.  Neck: Normal range of motion. Neck supple. No JVD present.  Cardiovascular: Normal rate, regular rhythm and normal heart sounds.   Pulmonary/Chest: Effort normal and breath sounds normal.  Abdominal: Soft. She exhibits no distension.  Musculoskeletal: Normal range of motion.  Lymphadenopathy:    She has no cervical adenopathy.  Neurological: She is alert and oriented to person, place, and time. No cranial nerve deficit.  Skin: Skin is warm. Rash noted.  Multiple discrete, red, edematous papules bilaterally on the upper or lower extremities and trunk. No pustules or vesicles. No  sign of infection.  Psychiatric: She has a normal mood and affect.  Nursing note and vitals reviewed.   ED Course  Procedures (including critical care time) Labs Review Labs Reviewed - No data to display  Imaging Review No results found.   MDM   1. Multiple insect bites   2. Pruritic erythematous rash    Treatment options discussed, as well as risks, benefits, alternatives. Over 25 minutes spent, greater than 50% of the time spent for counseling and coordination of care.  Patient  voiced understanding and agreement with the following plans: Discharge Medication List as of 12/10/2014 10:19 AM    START taking these medications   Details  hydrOXYzine (ATARAX/VISTARIL) 10 MG tablet Take 1 tablet (10 mg total) by mouth every 4 (four) hours as needed for itching. May take 2 at bedtime. Caution: May cause drowsiness, Starting 12/10/2014, Until Discontinued, Normal       advised her to take 2 by mouth stat of hydroxyzine and then one or 2 every 4-6 hours as needed for itch but precautions discussed. Solu-Medrol 125 mg IM stat She declined IM Benadryl here in urgent care, as she stated that she was the only one that could drive home, and there is a risk of severe drowsiness from IM Benadryl. She declined oral prednisone burst. She declined prescription steroid cream, and she prefers to use OTC hydrocortisone cream. Discussed usage. Precautions discussed. We discussed other symptomatic care Patient had numerous other questions are answered as best I could. She was worried that this could be shingles and I advised her that the rash is not consistent with shingles.      Lajean Manesavid Massey, MD 12/10/14 Claria [id number]  Lajean Manesavid Massey, MD 12/10/14 [phone]

## 2014-12-10 NOTE — ED Notes (Signed)
Pt presents with multiple mosquito bites throughout body x 2-3 days. C/o severe itching and burning, she is otherwise asymptomatic. Used cortisone cream and tylenol otc with some relief.

## 2014-12-10 NOTE — Telephone Encounter (Signed)
Unable to leave vm as pt does not have her mail box set up.

## 2015-02-12 ENCOUNTER — Encounter: Payer: Self-pay | Admitting: Cardiology

## 2015-04-16 ENCOUNTER — Telehealth: Payer: Self-pay | Admitting: *Deleted

## 2015-04-22 ENCOUNTER — Encounter: Payer: Self-pay | Admitting: Osteopathic Medicine

## 2015-04-22 ENCOUNTER — Ambulatory Visit (INDEPENDENT_AMBULATORY_CARE_PROVIDER_SITE_OTHER): Payer: BLUE CROSS/BLUE SHIELD | Admitting: Osteopathic Medicine

## 2015-04-22 VITALS — BP 95/65 | HR 81 | Ht 63.0 in | Wt 117.0 lb

## 2015-04-22 DIAGNOSIS — K589 Irritable bowel syndrome without diarrhea: Secondary | ICD-10-CM

## 2015-04-22 DIAGNOSIS — K219 Gastro-esophageal reflux disease without esophagitis: Secondary | ICD-10-CM

## 2015-04-22 MED ORDER — OMEPRAZOLE 20 MG PO CPDR: 20.0000 mg | DELAYED_RELEASE_CAPSULE | Freq: Every day | ORAL | Status: DC

## 2015-04-22 NOTE — Progress Notes (Signed)
HPI: Kathleen Beck is a 54 y.o. female who presents to [facility] [place]  today for chief complaint of:  Chief Complaint  Patient presents with  . Acute Visit    heartburn w/ acidity stomach    . Location: epigastric . Quality: burning . Severity: moderate . Duration: 1 - 2 days, started feeling gassy in 03/2015 . Context: woke up at 4:00 last night,  . Modifying factors: no spicy food. Tried beano for gassiness, no heartburn to start with however beano was helping but then last night severe heartburn.  . Assoc signs/symptoms: belching, heartburn, under a lot of stress, previously diagnosed with IBS   Past medical, social and family history reviewed: No past medical history on file. Past Surgical History  Procedure Laterality Date  . Tonsillectomy    . Dilation and curettage of uterus    . Cesarean section     Social History  Substance Use Topics  . Smoking status: Never Smoker   . Smokeless tobacco: Never Used  . Alcohol Use: Yes   Family History  Problem Relation Age of Onset  . Hyperlipidemia Mother   . Cancer Father     prostate  . ALS Father   . Hyperlipidemia Other     Current Outpatient Prescriptions  Medication Sig Dispense Refill  . Fish Oil-Cholecalciferol (FISH OIL + D3 PO) Take by mouth.    . Melatonin 5 MG TABS Take 5 mg by mouth at bedtime as needed.    . Multiple Vitamin (MULTIVITAMIN) tablet Take 1 tablet by mouth daily.    . progesterone (PROMETRIUM) 100 MG capsule Take 200 mg by mouth daily.      No current facility-administered medications for this visit.   Allergies  Allergen Reactions  . Entex La   . Robitussin (Alcohol Free) [Guaifenesin]   . Thimerosal Swelling     Review of Systems: CONSTITUTIONAL: Neg fever/chills, no unintentional weight changes HEAD/EYES/EARS/NOSE/THROAT: No headache/vision change or hearing change, no sore throat CARDIAC: No chest pain/pressure/palpitations, no  orthopnea RESPIRATORY: No cough GASTROINTESTINAL: No nausea/vomiting/abdominal pain/blood in stool/diarrhea/constipation, (+) heartburn/gassy as per HPI MUSCULOSKELETAL: No myalgia/arthralgia GENITOURINARY: No incontinence, No abnormal genital bleeding/discharge SKIN: No rash/wounds/concerning lesions PSYCHIATRIC: No concerns with depression/anxiety, (+) stress with family and work - lots going on, trouble sleeping   Exam:  BP 95/65 mmHg  Pulse 81  Ht 5\' 3"  (1.6 m)  Wt 117 lb (53.071 kg)  BMI 20.73 kg/m2  SpO2 99% Constitutional: VSS, see above. General Appearance: alert, well-developed, well-nourished, NAD Eyes: Normal lids and conjunctive, non-icteric sclera, Gastrointestinal: No hepatomegaly, no splenomegaly. No hernia appreciated. Bowel sounds normal. Rectal exam deferred.    No results found for this or any previous visit (from the past 72 hour(s)).    ASSESSMENT/PLAN:  Gastroesophageal reflux disease without esophagitis - Plan: omeprazole (PRILOSEC) 20 MG capsule 6 week trial, RTC if no improvement, can take Zantac as well, Tums if need rapid relief  Irritable bowel syndrome without diarrhea - FODMAPS diet reviewed and pt info printed

## 2015-04-22 NOTE — Patient Instructions (Addendum)
Try 6 weeks Omeprazole and then discontinue, if you get worse in the meantime or if heartburn comes back when you stop the medicine, let us know.  Severe abdominal pain and/or severe chest pain which doesn't go away warrants evaluation in the emergency room!

## 2015-05-12 NOTE — Telephone Encounter (Signed)
Error Message

## 2015-06-16 ENCOUNTER — Other Ambulatory Visit: Payer: Self-pay | Admitting: Osteopathic Medicine

## 2015-08-19 ENCOUNTER — Encounter: Payer: BLUE CROSS/BLUE SHIELD | Admitting: Family Medicine

## 2015-09-07 ENCOUNTER — Other Ambulatory Visit: Payer: Self-pay | Admitting: Osteopathic Medicine

## 2015-09-24 ENCOUNTER — Encounter: Payer: BLUE CROSS/BLUE SHIELD | Admitting: Family Medicine

## 2015-10-06 LAB — LIPID PANEL
CHOLESTEROL: 156 mg/dL (ref 0–200)
Cholesterol: 158 mg/dL (ref 0–200)
HDL: 60 mg/dL (ref 35–70)
HDL: 60 mg/dL (ref 35–70)
LDL CALC: 80 mg/dL
LDL Cholesterol: 80 mg/dL
LDL/HDL RATIO: 1.3
TRIGLYCERIDES: 78 mg/dL (ref 40–160)
Triglycerides: 78 mg/dL (ref 40–160)

## 2015-10-06 LAB — HEMOGLOBIN A1C
HEMOGLOBIN A1C: 5.9
Hemoglobin A1C: 5.9

## 2015-11-04 LAB — CBC AND DIFFERENTIAL
HEMATOCRIT: 44 % (ref 36–46)
Hemoglobin: 14.9 g/dL (ref 12.0–16.0)
Platelets: 196 10*3/uL (ref 150–399)
WBC: 4 10^3/mL

## 2015-11-04 LAB — BASIC METABOLIC PANEL
BUN: 20 mg/dL (ref 4–21)
CREATININE: 0.8 mg/dL (ref 0.5–1.1)
Glucose: 86 mg/dL
Potassium: 4.3 mmol/L (ref 3.4–5.3)
SODIUM: 142 mmol/L (ref 137–147)

## 2015-11-04 LAB — TESTOSTERONE
Estrogen, Total: 93
PROGESTERONE: 0.8
SEX HORMONE BINDING: 106.2
TESTOSTERONE FREE: 4.9
Testosterone: 38

## 2015-11-04 LAB — HEPATIC FUNCTION PANEL
ALT: 13 U/L (ref 7–35)
AST: 18 U/L (ref 13–35)
Alkaline Phosphatase: 48 U/L (ref 25–125)
BILIRUBIN, TOTAL: 0.4 mg/dL

## 2015-11-04 LAB — VITAMIN D 25 HYDROXY (VIT D DEFICIENCY, FRACTURES): VIT D 25 HYDROXY: 35.5

## 2015-11-04 LAB — TSH: TSH: 1.12 u[IU]/mL (ref 0.41–5.90)

## 2015-11-06 ENCOUNTER — Encounter: Payer: Self-pay | Admitting: Family Medicine

## 2015-11-10 ENCOUNTER — Encounter: Payer: Self-pay | Admitting: Family Medicine

## 2015-11-12 ENCOUNTER — Ambulatory Visit (INDEPENDENT_AMBULATORY_CARE_PROVIDER_SITE_OTHER): Payer: BLUE CROSS/BLUE SHIELD | Admitting: Family Medicine

## 2015-11-12 ENCOUNTER — Encounter: Payer: Self-pay | Admitting: Family Medicine

## 2015-11-12 VITALS — BP 89/55 | HR 89 | Wt 114.0 lb

## 2015-11-12 DIAGNOSIS — R7301 Impaired fasting glucose: Secondary | ICD-10-CM | POA: Diagnosis not present

## 2015-11-12 DIAGNOSIS — Z1211 Encounter for screening for malignant neoplasm of colon: Secondary | ICD-10-CM

## 2015-11-12 NOTE — Progress Notes (Signed)
Subjective:    Patient ID: Kathleen Beck, female    DOB: [birth year], 55 y.o.   MRN: [id number]  HPI Patient comes in today wanting to review labs. She had extensive lab work drawn through her OB/GYN. Overall everything was fairly normal except her hemoglobin A1c which was elevated at 5.9. She says that she has a family history diabetes and one grandparent but no first-degree relative. She is normally very healthy and works out and maintains a healthy BMI. She did admit that around that time though she was working on trying to sell her house and had not been exercising regularly and had been eating out a lot. No prior history of gestational diabetes.   Review of Systems BP 89/55 mmHg  Pulse 89  Wt 114 lb (51.71 kg)  SpO2 99%    Allergies  Allergen Reactions  . Entex La   . Robitussin (Alcohol Free) [Guaifenesin]   . Thimerosal Swelling    No past medical history on file.  Past Surgical History  Procedure Laterality Date  . Tonsillectomy    . Dilation and curettage of uterus    . Cesarean section      Social History   Social History  . Marital Status: Married    Spouse Name: N/A  . Number of Children: N/A  . Years of Education: N/A   Occupational History  . Not on file.   Social History Main Topics  . Smoking status: Never Smoker   . Smokeless tobacco: Never Used  . Alcohol Use: Yes  . Drug Use: No  . Sexual Activity: Not on file   Other Topics Concern  . Not on file   Social History Narrative    Family History  Problem Relation Age of Onset  . Hyperlipidemia Mother   . Cancer Father     prostate  . ALS Father   . Hyperlipidemia Other     Outpatient Encounter Prescriptions as of 11/12/2015  Medication Sig  . ascorbic acid (VITAMIN C) 1000 MG tablet Take 1,000 mg by mouth daily.  . Melatonin 5 MG TABS Take 5 mg by mouth at bedtime as needed.  [name] omeprazole (PRILOSEC) 20 MG capsule Take 20 mg by mouth as needed.  . progesterone (PROMETRIUM) 200 MG  capsule Take 200 mg by mouth daily.  . [DISCONTINUED] progesterone (PROMETRIUM) 100 MG capsule Take 200 mg by mouth daily.   . [DISCONTINUED] Fish Oil-Cholecalciferol (FISH OIL + D3 PO) Take by mouth.  . [DISCONTINUED] Multiple Vitamin (MULTIVITAMIN) tablet Take 1 tablet by mouth daily.  . [DISCONTINUED] omeprazole (PRILOSEC) 20 MG capsule TAKE 1 CAPSULE (20 MG TOTAL) BY MOUTH DAILY.   No facility-administered encounter medications on file as of 11/12/2015.          Objective:   Physical Exam  Constitutional: She is oriented to person, place, and time. She appears well-developed and well-nourished.  HENT:  Head: Normocephalic and atraumatic.  Eyes: Conjunctivae and EOM are normal.  Cardiovascular: Normal rate.   Pulmonary/Chest: Effort normal.  Neurological: She is alert and oriented to person, place, and time.  Skin: Skin is dry. No pallor.  Psychiatric: She has a normal mood and affect. Her behavior is normal.  Vitals reviewed.         Assessment & Plan:  Impaired fasting glucose-new diagnosis. We discussed working on diet and exercise and reviewed some strategies to do that. We will see her back in the end of May to recheck A1c. She is actually moving  June 1 2 [place]. We also discussed using metformin as a possibility as there is some evidence to show that it reduces progression to diabetes over time. She wants to hold off on that at this time.  Time spent 20 min, greater than 50% time spent counseling about abnormal glucose.

## 2015-11-12 NOTE — Progress Notes (Signed)
   Subjective:    Patient ID: Kathleen PileMaria M Robledo, female    DOB: May 06, 1961, 55 y.o.   MRN: [id number]  HPI    Review of Systems     Objective:   Physical Exam        Assessment & Plan:

## 2015-11-17 NOTE — Addendum Note (Signed)
Addended by: Deno EtienneBARKLEY, Carman Essick L on: 11/17/2015 02:10 PM   Modules accepted: Orders

## 2015-11-19 ENCOUNTER — Other Ambulatory Visit: Payer: Self-pay | Admitting: *Deleted

## 2015-11-19 DIAGNOSIS — Z1211 Encounter for screening for malignant neoplasm of colon: Secondary | ICD-10-CM

## 2015-11-19 LAB — POC HEMOCCULT BLD/STL (HOME/3-CARD/SCREEN)
FECAL OCCULT BLD: NEGATIVE
FECAL OCCULT BLD: NEGATIVE
FECAL OCCULT BLD: NEGATIVE

## 2015-12-03 ENCOUNTER — Ambulatory Visit (INDEPENDENT_AMBULATORY_CARE_PROVIDER_SITE_OTHER): Payer: BLUE CROSS/BLUE SHIELD | Admitting: Family Medicine

## 2015-12-03 VITALS — BP 91/53 | HR 86 | Wt 116.0 lb

## 2015-12-03 DIAGNOSIS — Z23 Encounter for immunization: Secondary | ICD-10-CM | POA: Diagnosis not present

## 2015-12-03 DIAGNOSIS — R7301 Impaired fasting glucose: Secondary | ICD-10-CM

## 2015-12-03 LAB — POCT GLYCOSYLATED HEMOGLOBIN (HGB A1C): HEMOGLOBIN A1C: 5.3

## 2015-12-03 MED ORDER — OMEPRAZOLE 20 MG PO CPDR: 20.0000 mg | DELAYED_RELEASE_CAPSULE | ORAL | Status: DC | PRN

## 2015-12-03 NOTE — Patient Instructions (Signed)
Have your A1C recheck in about 6 months, around end of November.

## 2015-12-03 NOTE — Progress Notes (Signed)
   Subjective:    Patient ID: Kathleen PileMaria M Guedes, female    DOB: 1961-03-08, 55 y.o.   MRN: [id number]  HPI IFG -  : A1c of 5.9 with March 28. She's here today for follow-up to have it rechecked.Doing well overall. She's really changed her diet. She's been sleeping been eating a lot more vegetables and trying to exercise regularly. She is moving next week and will need to establish with a new primary care provider. Looks absolutely fantastic. She is made some great improvements in a very short period of time. Congratulated her on this.   Review of Systems     Objective:   Physical Exam  Constitutional: She is oriented to person, place, and time. She appears well-developed and well-nourished.  HENT:  Head: Normocephalic and atraumatic.  Cardiovascular: Normal rate, regular rhythm and normal heart sounds.   Pulmonary/Chest: Effort normal and breath sounds normal.  Neurological: She is alert and oriented to person, place, and time.  Skin: Skin is warm and dry.  Psychiatric: She has a normal mood and affect. Her behavior is normal.          Assessment & Plan:  IFG - Repeat hemoglobin A1c of 5.3 today. He bit the good work. Have it rechecked again in 6 months. Continue work on diet and exercise.  Tdap given today.

## 2017-03-22 ENCOUNTER — Ambulatory Visit (INDEPENDENT_AMBULATORY_CARE_PROVIDER_SITE_OTHER): Payer: BLUE CROSS/BLUE SHIELD | Admitting: Physician Assistant

## 2017-03-22 ENCOUNTER — Encounter: Payer: Self-pay | Admitting: Physician Assistant

## 2017-03-22 VITALS — BP 105/62 | HR 76 | Wt 109.0 lb

## 2017-03-22 DIAGNOSIS — R109 Unspecified abdominal pain: Secondary | ICD-10-CM

## 2017-03-22 DIAGNOSIS — R1030 Lower abdominal pain, unspecified: Secondary | ICD-10-CM

## 2017-03-22 LAB — CBC WITH DIFFERENTIAL/PLATELET
BASOS ABS: 40 {cells}/uL (ref 0–200)
Basophils Relative: 0.7 %
EOS ABS: 120 {cells}/uL (ref 15–500)
Eosinophils Relative: 2.1 %
HCT: 40.8 % (ref 35.0–45.0)
Hemoglobin: 13.7 g/dL (ref 11.7–15.5)
Lymphs Abs: 1904 cells/uL (ref 850–3900)
MCH: 29 pg (ref 27.0–33.0)
MCHC: 33.6 g/dL (ref 32.0–36.0)
MCV: 86.3 fL (ref 80.0–100.0)
MONOS PCT: 10 %
MPV: 11 fL (ref 7.5–12.5)
NEUTROS PCT: 53.8 %
Neutro Abs: 3067 cells/uL (ref 1500–7800)
PLATELETS: 187 10*3/uL (ref 140–400)
RBC: 4.73 10*6/uL (ref 3.80–5.10)
RDW: 11.5 % (ref 11.0–15.0)
TOTAL LYMPHOCYTE: 33.4 %
WBC mixed population: 570 cells/uL (ref 200–950)
WBC: 5.7 10*3/uL (ref 3.8–10.8)

## 2017-03-22 LAB — POCT URINALYSIS DIPSTICK
BILIRUBIN UA: NEGATIVE
Glucose, UA: NEGATIVE
KETONES UA: NEGATIVE
Leukocytes, UA: NEGATIVE
NITRITE UA: NEGATIVE
PH UA: 7 (ref 5.0–8.0)
Protein, UA: NEGATIVE
RBC UA: NEGATIVE
Spec Grav, UA: 1.02 (ref 1.010–1.025)
Urobilinogen, UA: 0.2 E.U./dL

## 2017-03-22 MED ORDER — PHENAZOPYRIDINE HCL 200 MG PO TABS: 200.0000 mg | ORAL_TABLET | Freq: Three times a day (TID) | ORAL | 0 refills | Status: AC

## 2017-03-22 NOTE — Patient Instructions (Addendum)
Tumeric

## 2017-03-22 NOTE — Progress Notes (Signed)
Subjective:    Patient ID: Kathleen Beck, female    DOB: 13-Nov-1960, 56 y.o.   MRN: [id number]  HPI Pt is a 56 yo female who presents to the clinic with lower abdominal pain more left than right. She has had this pain for last few days but seemed to worsen yesterday. She also feels a lot of pressure and fullness in her bladder. She has some ongoing noctoria. No fever, chills, n/v/d. No bowel changes. No hx of diverticulitis or diverticulosis. Denies any constipation.   .. Active Ambulatory Problems    Diagnosis Date Noted  . ANXIETY STATE NOS 10/31/2006  . INSOMNIA, CHRONIC 11/06/2006  . GERD 11/16/2006  . IBS 09/17/2009  . KNEE PAIN, RIGHT 05/15/2007  . DIZZINESS 11/06/2006  . CHEST PAIN, ATYPICAL 09/03/2009  . IFG (impaired fasting glucose) 11/12/2015   Resolved Ambulatory Problems    Diagnosis Date Noted  . No Resolved Ambulatory Problems   No Additional Past Medical History      Review of Systems  All other systems reviewed and are negative.      Objective:   Physical Exam  Constitutional: She is oriented to person, place, and time. She appears well-developed and well-nourished.  HENT:  Head: Normocephalic and atraumatic.  Cardiovascular: Normal rate, regular rhythm and normal heart sounds.   Pulmonary/Chest: Effort normal and breath sounds normal. She has no wheezes.  No CVA tenderness.   Abdominal: Soft. Bowel sounds are normal. She exhibits no distension and no mass. There is tenderness. There is no rebound and no guarding.  Tenderness left lower quadrant to palpation. No guarding or rebound. No suprapubic tenderness.   Neurological: She is alert and oriented to person, place, and time.  Psychiatric: She has a normal mood and affect. Her behavior is normal.          Assessment & Plan:   [name].[name].Byrd HesselbachMaria was seen today for urinary tract infection.  Diagnoses and all orders for this visit:  Abdominal pain, unspecified abdominal location -     Urinalysis  Dipstick -     Urine Culture -     phenazopyridine (PYRIDIUM) 200 MG tablet; Take 1 tablet (200 mg total) by mouth 3 (three) times daily. For 2 days.  Lower abdominal pain -     phenazopyridine (PYRIDIUM) 200 MG tablet; Take 1 tablet (200 mg total) by mouth 3 (three) times daily. For 2 days. -     CBC with Differential/Platelet -     US PELVIS (TRANSABDOMINAL ONLY) -     US PELVIS TRANSVANGINAL NON-OB (TV ONLY)   . Results for orders placed or performed in visit on 03/22/17  CBC with Differential/Platelet  Result Value Ref Range   WBC 5.7 3.8 - 10.8 Thousand/uL   RBC 4.73 3.80 - 5.10 Million/uL   Hemoglobin 13.7 11.7 - 15.5 g/dL   HCT 40.940.8 81.135.0 - 91.445.0 %   MCV 86.3 80.0 - 100.0 fL   MCH 29.0 27.0 - 33.0 pg   MCHC 33.6 32.0 - 36.0 g/dL   RDW 78.211.5 95.611.0 - 21.315.0 %   Platelets 187 140 - 400 Thousand/uL   MPV 11.0 7.5 - 12.5 fL   Neutro Abs 3,067 1,500 - 7,800 cells/uL   Lymphs Abs 1,904 850 - 3,900 cells/uL   WBC mixed population 570 200 - 950 cells/uL   Eosinophils Absolute 120 15 - 500 cells/uL   Basophils Absolute 40 0 - 200 cells/uL   Neutrophils Relative % 53.8 %   Total Lymphocyte  33.4 %   Monocytes Relative 10.0 %   Eosinophils Relative 2.1 %   Basophils Relative 0.7 %  Urinalysis Dipstick  Result Value Ref Range   Color, UA yellow    Clarity, UA clear    Glucose, UA negative    Bilirubin, UA negative    Ketones, UA negative    Spec Grav, UA 1.020 1.010 - 1.025   Blood, UA negative    pH, UA 7.0 5.0 - 8.0   Protein, UA negative    Urobilinogen, UA 0.2 0.2 or 1.0 E.U./dL   Nitrite, UA negative    Leukocytes, UA Negative Negative   UA dipstick shows no infection. Will get culture.  Pyridium given for inflammation. Stay hydrated. Consider cranberry juice. Will get u/s to look for ovarian cyst. CBC to evaluate for likelyhood of diverticulitis or not. If WBC elevated will get CT.  Follow up as needed.

## 2017-03-23 ENCOUNTER — Other Ambulatory Visit: Payer: Self-pay

## 2017-03-23 ENCOUNTER — Ambulatory Visit: Payer: BLUE CROSS/BLUE SHIELD

## 2017-03-23 ENCOUNTER — Encounter (INDEPENDENT_AMBULATORY_CARE_PROVIDER_SITE_OTHER): Payer: Self-pay

## 2017-03-23 ENCOUNTER — Ambulatory Visit (INDEPENDENT_AMBULATORY_CARE_PROVIDER_SITE_OTHER): Payer: BLUE CROSS/BLUE SHIELD

## 2017-03-23 DIAGNOSIS — R1030 Lower abdominal pain, unspecified: Secondary | ICD-10-CM | POA: Diagnosis not present

## 2017-03-23 DIAGNOSIS — N3289 Other specified disorders of bladder: Secondary | ICD-10-CM

## 2017-03-24 ENCOUNTER — Encounter: Payer: Self-pay | Admitting: Physician Assistant

## 2017-03-24 LAB — URINE CULTURE
MICRO NUMBER: 81012906
MICRO NUMBER:: 81012978
RESULT: NO GROWTH
Result:: NO GROWTH
SPECIMEN QUALITY:: ADEQUATE
SPECIMEN QUALITY:: ADEQUATE

## 2017-03-24 LAB — FECAL OCCULT BLOOD, GUAIAC: FECAL OCCULT BLD: NEGATIVE

## 2017-03-24 LAB — LIPID PANEL
Cholesterol: 156 (ref 0–200)
HDL: 60 (ref 35–70)
LDL Cholesterol: 80
Triglycerides: 78 (ref 40–160)

## 2017-03-24 LAB — BASIC METABOLIC PANEL: GLUCOSE: 121

## 2017-03-24 LAB — HEMOGLOBIN A1C: Hemoglobin A1C: 5.4

## 2017-03-27 ENCOUNTER — Ambulatory Visit: Payer: Self-pay | Admitting: Physician Assistant

## 2017-03-27 ENCOUNTER — Encounter: Payer: Self-pay | Admitting: Physician Assistant

## 2017-03-27 ENCOUNTER — Ambulatory Visit (INDEPENDENT_AMBULATORY_CARE_PROVIDER_SITE_OTHER): Payer: BLUE CROSS/BLUE SHIELD | Admitting: Physician Assistant

## 2017-03-27 VITALS — BP 98/54 | HR 81 | Ht 63.0 in | Wt 110.0 lb

## 2017-03-27 DIAGNOSIS — G479 Sleep disorder, unspecified: Secondary | ICD-10-CM | POA: Diagnosis not present

## 2017-03-27 DIAGNOSIS — R1032 Left lower quadrant pain: Secondary | ICD-10-CM

## 2017-03-27 DIAGNOSIS — R143 Flatulence: Secondary | ICD-10-CM

## 2017-03-27 DIAGNOSIS — K5900 Constipation, unspecified: Secondary | ICD-10-CM

## 2017-03-27 NOTE — Progress Notes (Signed)
   Subjective:    Patient ID: Reggie Pile, female    DOB: 1961-01-12, 56 y.o.   MRN: [id number]  HPI Pt is a 56 yo female who presents to the clinic to follow up after left lower quadrant pain last week. She had normal CBC, pelvic ultrasound showed a lot of gas and no apparent abnormality with ovaries and uterus, urine culture was neg for bacterial growth. Her pain has resolved today. She did take a gas X and has a few good bowel movements. She now admits she has some hard lumpy stools without melena or hematochezia. Fecal occult done in June and was negative.   She mentions waking up in the middle of the night. She goes to sleep with melatonin  but can't stay asleep. She is currently really stressed with her husbands job loss.   .. Active Ambulatory Problems    Diagnosis Date Noted  . ANXIETY STATE NOS 10/31/2006  . INSOMNIA, CHRONIC 11/06/2006  . GERD 11/16/2006  . IBS 09/17/2009  . KNEE PAIN, RIGHT 05/15/2007  . DIZZINESS 11/06/2006  . CHEST PAIN, ATYPICAL 09/03/2009  . IFG (impaired fasting glucose) 11/12/2015  . Left lower quadrant pain 03/27/2017  . Flatulence 03/27/2017  . Constipation 03/27/2017   Resolved Ambulatory Problems    Diagnosis Date Noted  . No Resolved Ambulatory Problems   No Additional Past Medical History       Review of Systems  All other systems reviewed and are negative.      Objective:   Physical Exam  Constitutional: She appears well-developed and well-nourished.  Cardiovascular: Normal rate, regular rhythm and normal heart sounds.   Pulmonary/Chest: Effort normal and breath sounds normal.  No CVA tenderness.   Abdominal: Soft. Bowel sounds are normal. She exhibits no distension and no mass. There is no tenderness. There is no rebound and no guarding.  Psychiatric: She has a normal mood and affect. Her behavior is normal.          Assessment & Plan:  Marland KitchenMarland KitchenDiagnoses and all orders for this visit:  Constipation, unspecified  constipation type  Flatulence  Left lower quadrant pain  Trouble in sleeping   Likely LLQ pain was gas and constipation related.  Discussed probiotic, increasing fiber and water.  Consider food diary to look for trigger.  Goal bristol scale of 4.   Continue melatonin. Discussed relaxation techniques. Consider trying unisom. Discussed stress relief.   Spent 30 minutes with patient and greater than 50 percent of visit spent counseling patient on treatment plan.

## 2017-03-27 NOTE — Patient Instructions (Addendum)
Duclolax/miralax  Probiotics: culturelle Align    Constipation, Adult Constipation is when a person:  Poops (has a bowel movement) fewer times in a week than normal.  Has a hard time pooping.  Has poop that is dry, hard, or bigger than normal.  Follow these instructions at home: Eating and drinking   Eat foods that have a lot of fiber, such as: ? Fresh fruits and vegetables. ? Whole grains. ? Beans.  Eat less of foods that are high in fat, low in fiber, or overly processed, such as: ? [place] fries. ? Hamburgers. ? Cookies. ? Candy. ? Soda.  Drink enough fluid to keep your pee (urine) clear or pale yellow. General instructions  Exercise regularly or as told by your doctor.  Go to the restroom when you feel like you need to poop. Do not hold it in.  Take over-the-counter and prescription medicines only as told by your doctor. These include any fiber supplements.  Do pelvic floor retraining exercises, such as: ? Doing deep breathing while relaxing your lower belly (abdomen). ? Relaxing your pelvic floor while pooping.  Watch your condition for any changes.  Keep all follow-up visits as told by your doctor. This is important. Contact a doctor if:  You have pain that gets worse.  You have a fever.  You have not pooped for 4 days.  You throw up (vomit).  You are not hungry.  You lose weight.  You are bleeding from the anus.  You have thin, pencil-like poop (stool). Get help right away if:  You have a fever, and your symptoms suddenly get worse.  You leak poop or have blood in your poop.  Your belly feels hard or bigger than normal (is bloated).  You have very bad belly pain.  You feel dizzy or you faint. This information is not intended to replace advice given to you by your health care provider. Make sure you discuss any questions you have with your health care provider. Document Released: 12/14/2007 Document Revised: 01/15/2016 Document  Reviewed: 12/16/2015 Elsevier Interactive Patient Education  2017 [employer].

## 2017-03-28 ENCOUNTER — Encounter: Payer: Self-pay | Admitting: Family Medicine

## 2017-04-19 ENCOUNTER — Telehealth: Payer: Self-pay

## 2017-04-19 NOTE — Telephone Encounter (Signed)
Left VM with recommendation  

## 2017-04-19 NOTE — Telephone Encounter (Signed)
If she is having a lot of congestion that I recommend using Afrin just at bedtime to help decongest her temporal rarely so she can try to get a little bit more sleep. She can use for up to 3 or nights in a row. Could also try NyQuil if it's more cough and sputum.

## 2017-04-19 NOTE — Telephone Encounter (Signed)
Pt called and has a cold. She is having a hard time sleeping with this, she wants to know if there is c=something she can take to sleep.  She has been taking benadryl.  Please advise.

## 2017-06-15 LAB — RESULTS CONSOLE HPV: CHL HPV: NEGATIVE

## 2017-06-16 LAB — HM PAP SMEAR: HM PAP: NEGATIVE

## 2017-08-11 ENCOUNTER — Ambulatory Visit: Payer: BLUE CROSS/BLUE SHIELD | Admitting: Family Medicine

## 2017-08-18 ENCOUNTER — Ambulatory Visit: Payer: Self-pay | Admitting: Family Medicine

## 2017-09-01 ENCOUNTER — Ambulatory Visit: Payer: Self-pay | Admitting: Family Medicine

## 2017-09-14 ENCOUNTER — Telehealth: Payer: Self-pay | Admitting: Family Medicine

## 2017-09-14 NOTE — Telephone Encounter (Signed)
Patient walked-in adv that her insurance is ending 09/22/17 at midnight she is being seen tomorrow by Dr. Linford ArnoldMetheney and would like to know if she gives a stool card sample tomorrow would it be processed on or before September 22, 2017? Pt req a call back to let her know. Thanks

## 2017-09-14 NOTE — Telephone Encounter (Signed)
Called patient - advised of stool card process. Patient will obtain stool cards on tomorrow, Friday -09/15/17. Patient stated she understood - no further questions.

## 2017-09-15 ENCOUNTER — Encounter: Payer: Self-pay | Admitting: Family Medicine

## 2017-09-15 ENCOUNTER — Ambulatory Visit: Payer: Self-pay | Admitting: Family Medicine

## 2017-09-15 ENCOUNTER — Ambulatory Visit (INDEPENDENT_AMBULATORY_CARE_PROVIDER_SITE_OTHER): Payer: Managed Care, Other (non HMO) | Admitting: Family Medicine

## 2017-09-15 VITALS — BP 92/58 | HR 92 | Ht 63.0 in | Wt 124.0 lb

## 2017-09-15 DIAGNOSIS — R7301 Impaired fasting glucose: Secondary | ICD-10-CM

## 2017-09-15 DIAGNOSIS — K589 Irritable bowel syndrome without diarrhea: Secondary | ICD-10-CM | POA: Diagnosis not present

## 2017-09-15 DIAGNOSIS — J018 Other acute sinusitis: Secondary | ICD-10-CM | POA: Diagnosis not present

## 2017-09-15 DIAGNOSIS — Z1231 Encounter for screening mammogram for malignant neoplasm of breast: Secondary | ICD-10-CM

## 2017-09-15 DIAGNOSIS — Z299 Encounter for prophylactic measures, unspecified: Secondary | ICD-10-CM

## 2017-09-15 LAB — HM MAMMOGRAPHY

## 2017-09-15 LAB — POCT GLYCOSYLATED HEMOGLOBIN (HGB A1C): HEMOGLOBIN A1C: 5.9

## 2017-09-15 MED ORDER — AMOXICILLIN-POT CLAVULANATE 875-125 MG PO TABS: 1.0000 | ORAL_TABLET | Freq: Two times a day (BID) | ORAL | 0 refills | Status: DC

## 2017-09-15 NOTE — Progress Notes (Signed)
Subjective:    CC: IFG   HPI:  Impaired fasting glucose-no increased thirst or urination. No symptoms consistent with hypoglycemia.  She has gained some weight since she was last here but otherwise is doing well.  She admits she is been eating a lot more sweets and has been traveling to visit family and eating much more rich food and what she is used to.  She also reports that she is had an upper respiratory symptoms for about a week including nasal congestion mild sore throat.  No fever.  No vomiting.  No nausea.  No cough.  She has just been taking an oral decongestant in the mornings.  She is been sick since February 26.  She also reports that her IBS has been flaring a little bit more than usual but she notices also from increased stress.  She and her family will be moving back to [place] [facility] [place].  Her husband will be going overseas again for another couple of months.  She reports that she is up-to-date with her Pap smear.  She saw Dr. Wandalee FerdinandMelissa Helman [facility] [place] back in September.  And she had her mammogram done today.      Past medical history, Surgical history, Family history not pertinant except as noted below, Social history, Allergies, and medications have been entered into the medical record, reviewed, and corrections made.   Review of Systems: No fevers, chills, night sweats, weight loss, chest pain, or shortness of breath.   Objective:    General: Well Developed, well nourished, and in no acute distress.  Neuro: Alert and oriented x3, extra-ocular muscles intact, sensation grossly intact.  HEENT: Normocephalic, atraumatic, oropharynx is clear, TMs and canals are clear bilaterally.  No significant cervical lymphadenopathy. Skin: Warm and dry, no rashes. Cardiac: Regular rate and rhythm, no murmurs rubs or gallops, no lower extremity edema.  Respiratory: Clear to auscultation bilaterally. Not using accessory muscles, speaking in full  sentences.   Impression and Recommendations:    IFG -hemoglobin A1c up to 5.9 today.  Discussed strategies around getting back on track with healthy diet and regular exercise and getting back down to the weight that she would like to be at which is 118 pounds.  Acute sinusitis-recommend treat with Augmentin if she is not getting better in the next couple of days.  IBS-no change to regimen today which is work on lowering stress levels and getting adequate sleep.  Her mammogram performed today.    Lung cancer screening-given stool cards to do today.  For mammogram and Pap smear will call Dr. Samuel BoucheHellman's office for recent report.

## 2017-09-18 LAB — POC HEMOCCULT BLD/STL (HOME/3-CARD/SCREEN)
Card #2 Fecal Occult Blod, POC: NEGATIVE
Card #3 Fecal Occult Blood, POC: NEGATIVE
FECAL OCCULT BLD: NEGATIVE

## 2017-09-18 NOTE — Addendum Note (Signed)
Addended by: Deno EtienneBARKLEY, Eugene Zeiders L on: 09/18/2017 04:32 PM   Modules accepted: Orders

## 2017-09-18 NOTE — Addendum Note (Signed)
Addended by: Deno EtienneBARKLEY, Olar Santini L on: 09/18/2017 04:33 PM   Modules accepted: Orders

## 2017-10-17 ENCOUNTER — Encounter: Payer: Self-pay | Admitting: Family Medicine

## 2017-10-24 ENCOUNTER — Telehealth: Payer: Self-pay

## 2017-10-24 ENCOUNTER — Encounter: Payer: Self-pay | Admitting: Family Medicine

## 2017-10-24 NOTE — Telephone Encounter (Signed)
Yes, this is OK.  Make sure adjusting diet as well to control reflux.

## 2017-10-24 NOTE — Progress Notes (Signed)
Negative for intraepithelial lesion or malignancy.  

## 2017-10-25 NOTE — Telephone Encounter (Signed)
Left message advising patient of recommendations.  °

## 2018-01-06 ENCOUNTER — Telehealth: Payer: Self-pay | Admitting: Family Medicine

## 2018-01-06 NOTE — Telephone Encounter (Signed)
Kathleen Beck called after hour nurse line. She is having LLQ abd pain for the last 2 days. She did have increased stools but no vomiting or diarrhea. She denies any urinary symptoms. She has a self reported diagnosis of IBS and notes that in the past she tried doxalyamine which has helped a bit. In September of 2018 she was treated with pyridium for dysuria/pelvic pain. She would like a rx for pyridium now. She is worried also because she is leaving the country on the 4th to go on Vacation. She also notes that she has a [employer] deductible insurance plan.   I advised going to Urgent care now. She deferred and I made an appointment for her on Monday with Dr Linford ArnoldMetheney her PCP. I also stated that pyridium is unlikely to help as she does not have urinary symptoms. I also stated that it is available OTC as AZO.   Plan watchful waiting and follow up Monday. To to ED/UC sooner if needed or if worse.

## 2018-01-08 ENCOUNTER — Telehealth: Payer: Self-pay | Admitting: Family Medicine

## 2018-01-08 ENCOUNTER — Ambulatory Visit: Payer: Self-pay | Admitting: Family Medicine

## 2018-01-08 NOTE — Telephone Encounter (Signed)
PT Called at 8:28 to cancel 10:50 APPT. (Same Day)

## 2018-03-17 ENCOUNTER — Encounter: Payer: Self-pay | Admitting: Family Medicine

## 2018-03-21 NOTE — Telephone Encounter (Signed)
Pt place follow up call, requesting a MyChart response. Please advise

## 2018-03-23 ENCOUNTER — Ambulatory Visit: Payer: Self-pay | Admitting: Family Medicine

## 2018-03-27 ENCOUNTER — Telehealth: Payer: Self-pay

## 2018-03-27 NOTE — Telephone Encounter (Signed)
Pt has been having troubles sleeping. Was advised at one time she could take Unisom to help her sleep, but she is afraid of becoming dependent.   Pt only take 1/4 of a tablet at a time. Wanting to know how many nights a week it's okay for her to take 1/4 of a table to help her sleep.   Please advise

## 2018-03-27 NOTE — Telephone Encounter (Signed)
I think every other night would be fine.

## 2018-03-27 NOTE — Telephone Encounter (Signed)
Called and left pt VM pf providers recommendation

## 2018-04-06 ENCOUNTER — Ambulatory Visit: Payer: BC Managed Care – PPO | Admitting: Family Medicine

## 2018-04-06 ENCOUNTER — Encounter: Payer: Self-pay | Admitting: Family Medicine

## 2018-04-06 VITALS — BP 112/68 | HR 77 | Ht 63.0 in | Wt 122.0 lb

## 2018-04-06 DIAGNOSIS — R7301 Impaired fasting glucose: Secondary | ICD-10-CM | POA: Diagnosis not present

## 2018-04-06 DIAGNOSIS — Z79890 Hormone replacement therapy (postmenopausal): Secondary | ICD-10-CM

## 2018-04-06 DIAGNOSIS — N951 Menopausal and female climacteric states: Secondary | ICD-10-CM | POA: Insufficient documentation

## 2018-04-06 DIAGNOSIS — Z780 Asymptomatic menopausal state: Secondary | ICD-10-CM | POA: Insufficient documentation

## 2018-04-06 DIAGNOSIS — K581 Irritable bowel syndrome with constipation: Secondary | ICD-10-CM | POA: Diagnosis not present

## 2018-04-06 DIAGNOSIS — F5101 Primary insomnia: Secondary | ICD-10-CM | POA: Diagnosis not present

## 2018-04-06 LAB — POCT GLYCOSYLATED HEMOGLOBIN (HGB A1C): Hemoglobin A1C: 5.5 % (ref 4.0–5.6)

## 2018-04-06 MED ORDER — AMITRIPTYLINE HCL 10 MG PO TABS: 5.0000 mg | ORAL_TABLET | Freq: Every day | ORAL | 0 refills | Status: DC

## 2018-04-06 NOTE — Patient Instructions (Signed)
Also consider a trial of IBgard for your IBS.

## 2018-04-06 NOTE — Progress Notes (Signed)
   Subjective:    Patient ID: Kathleen Beck, female    DOB: [birth year], 57 y.o.   MRN: [id number]  HPI Impaired fasting glucose-no increased thirst or urination. No symptoms consistent with hypoglycemia.  HRT -currently on bioidentical hormones.  She currently applies estradiol 0.5 mg/testosterone 0.5 mg 4 clicks topically daily.  She gets it from [facility].  Her provider who writes this for her retired and she would like me to take over the prescription.  He does feel like her irritable bowel has been flaring more recently.  She is also been under a lot of stress.  Her husband Kathleen Beck has been deployed again and so she is basically a single mom can.  She is taking care of her teenager at home who is doing well in school.  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  So she is also been commuting.  She normally has constipation predominant predominant but lately has been having more diarrhea and pain.  She thinks that some ice cream that she had last week and caused a flare.  It was then better for a couple days and then think she ate too much fruit and feel like that caused a flare today.  She has been exercising more overall feels like her diet has been better.  She does feel that she has good support and has been talking to friends.  Had a day earlier this week where she felt like she got lightheaded at work but she had not been hydrating and eating as much as she usually does.  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  She was able to sit down during the next class and was able to make it through the workday.   Review of Systems     Objective:   Physical Exam  Constitutional: She is oriented to person, place, and time. She appears well-developed and well-nourished.  HENT:  Head: Normocephalic and atraumatic.  Cardiovascular: Normal rate, regular rhythm  and normal heart sounds.  Pulmonary/Chest: Effort normal and breath sounds normal.  Neurological: She is alert and oriented to person, place, and time.  Skin: Skin is warm and dry.  Psychiatric: She has a normal mood and affect. Her behavior is normal.        Assessment & Plan:  IFG - Well controlled.  In fact her A1c is down to 5.5 and looks fantastic.  Follow-up in 6 months.  IBS - c -we discussed options including a trial of amitriptyline at bedtime.  We will start with 5 mg nightly.  We also discussed possibly IBgard, peppermint oil capsules.  I gave her the name of that so she can check into it and consider it.  BHRT -I will be happy to take over her hormone therapy.  She uses [facility].  She has not had hormone testing in a while so we will go ahead and get updated levels.  She mostly applies this topically to the vaginal area.  Insomnia  - doing well on otc sleep aid. Taking 1/2 tab every other night and ti doesn work.

## 2018-04-10 LAB — TSH: TSH: 1.56 m[IU]/L (ref 0.40–4.50)

## 2018-04-10 LAB — CBC
HCT: 41.6 % (ref 35.0–45.0)
HEMOGLOBIN: 13.7 g/dL (ref 11.7–15.5)
MCH: 28.8 pg (ref 27.0–33.0)
MCHC: 32.9 g/dL (ref 32.0–36.0)
MCV: 87.6 fL (ref 80.0–100.0)
MPV: 11.4 fL (ref 7.5–12.5)
PLATELETS: 201 10*3/uL (ref 140–400)
RBC: 4.75 10*6/uL (ref 3.80–5.10)
RDW: 11.4 % (ref 11.0–15.0)
WBC: 4.2 10*3/uL (ref 3.8–10.8)

## 2018-04-10 LAB — FOLLICLE STIMULATING HORMONE: FSH: 116.9 m[IU]/mL — ABNORMAL HIGH

## 2018-04-10 LAB — COMPLETE METABOLIC PANEL WITH GFR
AG RATIO: 1.9 (calc) (ref 1.0–2.5)
ALKALINE PHOSPHATASE (APISO): 43 U/L (ref 33–130)
ALT: 14 U/L (ref 6–29)
AST: 17 U/L (ref 10–35)
Albumin: 4.1 g/dL (ref 3.6–5.1)
BUN: 14 mg/dL (ref 7–25)
CO2: 30 mmol/L (ref 20–32)
CREATININE: 0.74 mg/dL (ref 0.50–1.05)
Calcium: 9.5 mg/dL (ref 8.6–10.4)
Chloride: 104 mmol/L (ref 98–110)
GFR, Est African American: 105 mL/min/{1.73_m2} (ref >=60)
GFR, Est Non African American: 91 mL/min/{1.73_m2} (ref >=60)
GLOBULIN: 2.2 g/dL (ref 1.9–3.7)
Glucose, Bld: 98 mg/dL (ref 65–99)
Potassium: 4.5 mmol/L (ref 3.5–5.3)
SODIUM: 141 mmol/L (ref 135–146)
Total Bilirubin: 0.4 mg/dL (ref 0.2–1.2)
Total Protein: 6.3 g/dL (ref 6.1–8.1)

## 2018-04-10 LAB — LIPID PANEL
CHOL/HDL RATIO: 2.9 (calc) (ref <5.0)
CHOLESTEROL: 176 mg/dL (ref <200)
HDL: 60 mg/dL (ref >50)
LDL Cholesterol (Calc): 99 mg/dL (calc)
NON-HDL CHOLESTEROL (CALC): 116 mg/dL (ref <130)
Triglycerides: 78 mg/dL (ref <150)

## 2018-04-10 LAB — ESTRADIOL: Estradiol: <15 pg/mL

## 2018-04-10 LAB — PROGESTERONE

## 2018-04-10 LAB — LUTEINIZING HORMONE: LH: 50.6 m[IU]/mL

## 2018-04-11 ENCOUNTER — Ambulatory Visit: Payer: BC Managed Care – PPO | Admitting: Physician Assistant

## 2018-04-11 ENCOUNTER — Encounter: Payer: Self-pay | Admitting: Physician Assistant

## 2018-04-11 VITALS — BP 81/55 | HR 96 | Ht 63.0 in | Wt 122.0 lb

## 2018-04-11 DIAGNOSIS — T2010XA Burn of first degree of head, face, and neck, unspecified site, initial encounter: Secondary | ICD-10-CM | POA: Diagnosis not present

## 2018-04-11 MED ORDER — SILVER SULFADIAZINE 1 % EX CREA: 1.0000 "application " | TOPICAL_CREAM | Freq: Every day | CUTANEOUS | 0 refills | Status: DC

## 2018-04-11 NOTE — Patient Instructions (Signed)
Burn Care, Adult  A burn is an injury to the skin or the tissues under the skin. There are three types of burns:  · First degree. These burns may cause the skin to be red and slightly swollen.  · Second degree. These burns are very painful and cause the skin to be very red. The skin may also leak fluid, look shiny, and develop blisters.  · Third degree. These burns cause permanent damage. They either turn the skin white or black and make it look charred, dry, and leathery.    Taking care of your burn properly can help to prevent pain and infection. It can also help the burn to heal more quickly.  What are the risks?  Complications from burns include:  · Damage to the skin.  · Reduced blood flow near the injury.  · Dead tissue.  · Scarring.  · Problems with movement, if the burn happened near a joint or on the hands or feet.    Severe burns can lead to problems that affect the whole body, such as:  · Fluid loss.  · Less blood circulating in the body.  · Inability to maintain a normal core body temperature (thermoregulation).  · Infection.  · Shock.  · Problems breathing.    How to care for a first-degree burn  Right after a burn:  · Rinse or soak the burn under cool water until the pain stops. Do not put ice on your burn. This can cause more damage.  · Lightly cover the burn with a sterile cloth (dressing).  Burn care  · Follow instructions from your health care provider about:  ? How to clean and take care of the burn.  ? When to change and remove the dressing.  · Check your burn every day for signs of infection. Check for:  ? More redness, swelling, or pain.  ? Warmth.  ? Pus or a bad smell.  Medicine  · Take over-the-counter and prescription medicines only as told by your health care provider.  · If you were prescribed antibiotic medicine, take or apply it as told by your health care provider. Do not stop using the antibiotic even if your condition improves.  General instructions  · To prevent infection, do not  put butter, oil, or other home remedies on your burn.  · Do not rub your burn, even when you are cleaning it.  · Protect your burn from the sun.  How to care for a second-degree burn  Right after a burn:  · Rinse or soak the burn under cool water. Do this for several minutes. Do not put ice on your burn. This can cause more damage.  · Lightly cover the burn with a sterile cloth (dressing).  Burn care  · Raise (elevate) the injured area above the level of your heart while sitting or lying down.  · Follow instructions from your health care provider about:  ? How to clean and take care of the burn.  ? When to change and remove the dressing.  · Check your burn every day for signs of infection. Check for:  ? More redness, swelling, or pain.  ? Warmth.  ? Pus or a bad smell.  Medicine    · Take over-the-counter and prescription medicines only as told by your health care provider.  · If you were prescribed antibiotic medicine, take or apply it as told by your health care provider. Do not stop using the antibiotic even if your   condition improves.  General instructions  · To prevent infection:  ? Do not put butter, oil, or other home remedies on the burn.  ? Do not scratch or pick at the burn.  ? Do not break any blisters.  ? Do not peel skin.  · Do not rub your burn, even when you are cleaning it.  · Protect your burn from the sun.  How to care for a third-degree burn  Right after a burn:  · Lightly cover the burn with gauze.  · Seek immediate medical attention.  Burn care  · Raise (elevate) the injured area above the level of your heart while sitting or lying down.  · Drink enough fluid to keep your urine clear or pale yellow.  · Rest as told by your health care provider. Do not participate in sports or other physical activities until your health care provider approves.  · Follow instructions from your health care provider about:  ? How to clean and take care of the burn.  ? When to change and remove the dressing.  · Check  your burn every day for signs of infection. Check for:  ? More redness, swelling, or pain.  ? Warmth.  ? Pus or a bad smell.  Medicine  · Take over-the-counter and prescription medicines only as told by your health care provider.  · If you were prescribed antibiotic medicine, take or apply it as told by your health care provider. Do not stop using the antibiotic even if your condition improves.  General instructions  · To prevent infection:  ? Do not put butter, oil, or other home remedies on the burn.  ? Do not scratch or pick at the burn.  ? Do not break any blisters.  ? Do not peel skin.  ? Do not rub your burn, even when you are cleaning it.  · Protect your burn from the sun.  · Keep all follow-up visits as told by your health care provider. This is important.  Contact a health care provider if:  · Your condition does not improve.  · Your condition gets worse.  · You have a fever.  · Your burn changes in appearance or develops black or red spots.  · Your burn feels warm to the touch.  · Your pain is not controlled with medicine.  Get help right away if:  · You have redness, swelling, or pain at the site of the burn.  · You have fluid, blood, or pus coming from your burn.  · You have red streaks near the burn.  · You have severe pain.  This information is not intended to replace advice given to you by your health care provider. Make sure you discuss any questions you have with your health care provider.  Document Released: 06/27/2005 Document Revised: 01/17/2016 Document Reviewed: 12/15/2015  Elsevier Interactive Patient Education © 2018 [employer]

## 2018-04-11 NOTE — Progress Notes (Signed)
d 

## 2018-04-13 ENCOUNTER — Encounter: Payer: Self-pay | Admitting: Physician Assistant

## 2018-04-13 ENCOUNTER — Ambulatory Visit: Payer: BC Managed Care – PPO | Admitting: Physician Assistant

## 2018-04-13 VITALS — BP 94/64 | HR 87 | Wt 123.0 lb

## 2018-04-13 DIAGNOSIS — T300 Burn of unspecified body region, unspecified degree: Secondary | ICD-10-CM | POA: Diagnosis not present

## 2018-04-13 DIAGNOSIS — T2010XA Burn of first degree of head, face, and neck, unspecified site, initial encounter: Secondary | ICD-10-CM | POA: Insufficient documentation
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
## 2018-04-14 ENCOUNTER — Encounter: Payer: Self-pay | Admitting: Physician Assistant
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
## 2018-04-20 ENCOUNTER — Telehealth: Payer: Self-pay

## 2018-04-20 NOTE — Telephone Encounter (Signed)
Ok to RF. I don't see the fax to sign.

## 2018-04-20 NOTE — Telephone Encounter (Signed)
Pt called [facility] looking for a hormone cream. Last OV with Dr Linford Arnold did state she would take over hormone therapy, but no issues noted with pt's labs.   [facility] pharmacy faxed over a RF request for an estriol 0.5mg /testosterone 0.5mg  cream, apply 4 clicks (1 ML) topically daily as directed. Previously written by Tilden Fossa. Patient last had this filled on 12-31-2016.

## 2018-04-23 ENCOUNTER — Other Ambulatory Visit: Payer: Self-pay | Admitting: *Deleted

## 2018-04-23 MED ORDER — AMBULATORY NON FORMULARY MEDICATION: 0 refills | Status: DC

## 2018-04-23 NOTE — Telephone Encounter (Signed)
Paper fax in Dr Shelah Lewandowsky in-box to sign and fax back. I have added this medication to pt's med list  Margarito Courser B., CMA that RX was in box to be signed and faxed.

## 2018-04-25 ENCOUNTER — Telehealth: Payer: Self-pay

## 2018-04-25 NOTE — Telephone Encounter (Signed)
There are some studies suggesting that omeprazole can increase risk for gastric cancer.  That causality has not been proven though.  But that is 1 of the reasons that we try not to have people take them for very long periods of time.  We usually recommend taking it for a few weeks to control symptoms and then coming back off of the medication if able to tolerate without.  Some people are able to control her symptoms mostly through diet.  If not then you can consider an H2 blocker such as Tagamet or Zantac.  Right now the Zantac has some manufacturing issues so if you decide to do this I would recommend sticking with the Tagamet for right now.

## 2018-04-25 NOTE — Telephone Encounter (Signed)
Patient advised of recommendations.  

## 2018-04-25 NOTE — Telephone Encounter (Signed)
Kathleen Beck called to see if she needs to stop taking the omeprazole 20 mg due to cancer link.   She also wanted to know if she is ok taking vitamin c 1000 mg daily and vitamin D 5000 IU daily.   Please advise.

## 2018-06-13 ENCOUNTER — Telehealth: Payer: Self-pay

## 2018-06-13 NOTE — Telephone Encounter (Signed)
Left message advising of recommendations.  

## 2018-06-13 NOTE — Telephone Encounter (Signed)
Kathleen HesselbachMaria called and states the amitriptyline helped her IBS at first. Now she has noticed a few more episodes of IBS flare up. She wanted to know if she could safely increase the amitriptyline. Please advise.

## 2018-06-13 NOTE — Telephone Encounter (Signed)
Yes, okay to increase.  I believe she is only taking a half a tab currently so okay to increase to a whole tab.

## 2018-06-14 MED ORDER — AMITRIPTYLINE HCL 10 MG PO TABS: 10.0000 mg | ORAL_TABLET | Freq: Every day | ORAL | 0 refills | Status: DC

## 2018-06-14 NOTE — Telephone Encounter (Signed)
Pt called back- confirmed she received msg.Will take 1 whole tab. States she needs RX to reflect this, will send 30 day RX for 1 tab QD.   RX sent to [facility] per ot request, FYI to PCP .

## 2018-07-20 ENCOUNTER — Ambulatory Visit: Payer: BC Managed Care – PPO | Admitting: Family Medicine

## 2018-07-20 ENCOUNTER — Encounter: Payer: Self-pay | Admitting: Family Medicine

## 2018-07-20 VITALS — BP 114/74 | HR 90 | Temp 98.4°F | Ht 63.0 in | Wt 119.0 lb

## 2018-07-20 DIAGNOSIS — J029 Acute pharyngitis, unspecified: Secondary | ICD-10-CM | POA: Diagnosis not present

## 2018-07-20 LAB — POCT RAPID STREP A (OFFICE): RAPID STREP A SCREEN: NEGATIVE

## 2018-07-20 NOTE — Progress Notes (Signed)
Acute Office Visit  Subjective:    Patient ID: Kathleen Beck, female    DOB: Feb 13, 1961, 58 y.o.   MRN: [id number]  Chief Complaint  Patient presents with  . Sore Throat    she reports that she has bee feeling like this x1 wk. got worse on monday. she has been very hoarse, denies f/s/c    HPI Patient is in today for sore throat and trouble sleeping.  Has had a sore throat for 1 week.  She feels like she got worse on Monday she has been hoarse and has denied any fevers chills or sweats.  Using Mucinex and fluticasone.  He has been running a humidifier as well.  She also wants to discuss her sleep.  She has not been sleeping well since the ST started.  She has been using an over-the-counter sleep aid but says it is no longer working.  History reviewed. No pertinent past medical history.  Past Surgical History:  Procedure Laterality Date  . CESAREAN SECTION    . DILATION AND CURETTAGE OF UTERUS    . TONSILLECTOMY      Family History  Problem Relation Age of Onset  . Hyperlipidemia Mother   . Cancer Father        prostate  . ALS Father   . Hyperlipidemia Other     Social History   Socioeconomic History  . Marital status: Married    Spouse name: Not on file  . Number of children: Not on file  . Years of education: Not on file  . Highest education level: Not on file  Occupational History  . Not on file  Social Needs  . Financial resource strain: Not on file  . Food insecurity:    Worry: Not on file    Inability: Not on file  . Transportation needs:    Medical: Not on file    Non-medical: Not on file  Tobacco Use  . Smoking status: Never Smoker  . Smokeless tobacco: Never Used  Substance and Sexual Activity  . Alcohol use: Yes  . Drug use: No  . Sexual activity: Not on file  Lifestyle  . Physical activity:    Days per week: Not on file    Minutes per session: Not on file  . Stress: Not on file  Relationships  . Social connections:    Talks on phone:  Not on file    Gets together: Not on file    Attends religious service: Not on file    Active member of club or organization: Not on file    Attends meetings of clubs or organizations: Not on file    Relationship status: Not on file  . Intimate partner violence:    Fear of current or ex partner: Not on file    Emotionally abused: Not on file    Physically abused: Not on file    Forced sexual activity: Not on file  Other Topics Concern  . Not on file  Social History Narrative  . Not on file    Outpatient Medications Prior to Visit  Medication Sig Dispense Refill  . AMBULATORY NON FORMULARY MEDICATION Estriol 0.5/ testosterone 0.5 mg 30 mL 0  . amitriptyline (ELAVIL) 10 MG tablet Take 1 tablet (10 mg total) by mouth at bedtime. 30 tablet 0  . ascorbic acid (VITAMIN C) 1000 MG tablet Take 1,000 mg by mouth daily.    . Cholecalciferol (VITAMIN D3) 5000 units CAPS     . Cimetidine (  TAGAMET PO) Take by mouth.    . Dextromethorphan-guaiFENesin (MUCINEX DM PO) Take by mouth.    . DOXYLAMINE SUCCINATE, SLEEP, PO Take by mouth.    [name]. FLUTICASONE PROPIONATE, NASAL, NA Place 1 spray into the nose.    . Melatonin 5 MG TABS Take 5 mg by mouth at bedtime.    . NON FORMULARY Omega 3 eye drops    . RESTASIS 0.05 % ophthalmic emulsion INSTILL ONE DROP IN EACH EYE TWICE DAILY  0  . Omeprazole 20 MG TBEC     . Probiotic Product ([facility] DIGESTIVE PROBIOTIC PO)     . silver sulfADIAZINE (SILVADENE) 1 % cream Apply 1 application topically daily. 400 g 0   No facility-administered medications prior to visit.     Allergies  Allergen Reactions  . Guaifenesin     Other reaction(s): Unknown  . Thimerosal Swelling  . Entex La     ROS     Objective:    Physical Exam  Constitutional: She is oriented to person, place, and time. She appears well-developed and well-nourished.  HENT:  Head: Normocephalic and atraumatic.  Right Ear: External ear normal.  Left Ear: External ear normal.  Nose: Nose  normal.  Mouth/Throat: Oropharynx is clear and moist.  TMs and canals are clear.   Eyes: Pupils are equal, round, and reactive to light. Conjunctivae and EOM are normal.  Neck: Neck supple. No thyromegaly present.  Cardiovascular: Normal rate, regular rhythm and normal heart sounds.  Pulmonary/Chest: Effort normal and breath sounds normal. She has no wheezes.  Lymphadenopathy:    She has no cervical adenopathy.  Neurological: She is alert and oriented to person, place, and time.  Skin: Skin is warm and dry.  Psychiatric: She has a normal mood and affect.    BP 114/74   Pulse 90   Temp 98.4 F (36.9 C)   Ht 5\' 3"  (1.6 m)   Wt 119 lb (54 kg)   SpO2 100%   BMI 21.08 kg/m  Wt Readings from Last 3 Encounters:  07/20/18 119 lb (54 kg)  04/13/18 123 lb (55.8 kg)  04/11/18 122 lb (55.3 kg)    Health Maintenance Due  Topic Date Due  . Hepatitis C Screening  [birth year]  . HIV Screening  12/23/1976  . INFLUENZA VACCINE  02/08/2018    There are no preventive care reminders to display for this patient.   Lab Results  Component Value Date   TSH 1.56 04/09/2018   Lab Results  Component Value Date   WBC 4.2 04/09/2018   HGB 13.7 04/09/2018   HCT 41.6 04/09/2018   MCV 87.6 04/09/2018   PLT 201 04/09/2018   Lab Results  Component Value Date   NA 141 04/09/2018   K 4.5 04/09/2018   CO2 30 04/09/2018   GLUCOSE 98 04/09/2018   BUN 14 04/09/2018   CREATININE 0.74 04/09/2018   BILITOT 0.4 04/09/2018   ALKPHOS 48 11/04/2015   AST 17 04/09/2018   ALT 14 04/09/2018   PROT 6.3 04/09/2018   ALBUMIN 4.5 09/03/2009   CALCIUM 9.5 04/09/2018   Lab Results  Component Value Date   CHOL 176 04/09/2018   Lab Results  Component Value Date   HDL 60 04/09/2018   Lab Results  Component Value Date   LDLCALC 99 04/09/2018   Lab Results  Component Value Date   TRIG 78 04/09/2018   Lab Results  Component Value Date   CHOLHDL 2.9 04/09/2018   Lab Results  Component Value  Date   HGBA1C 5.5 04/06/2018       Assessment & Plan:   Problem List Items Addressed This Visit    None    Visit Diagnoses    Pharyngitis, unspecified etiology    -  Primary   Relevant Orders   POCT rapid strep A (Completed)     Rapid strep negative.  Likely viral.  Discussed symptomatic care.  Call if not better in 1 week.  So encouraged hydration and voice rest.  For poor sleep since she has been sick okay to take 2 Benadryl at bedtime.  Did warn about potential for sedation and grogginess but she will not be working this weekend.  No orders of the defined types were placed in this encounter.    Nani Gasser, MD

## 2018-07-20 NOTE — Patient Instructions (Signed)

## 2018-08-03 IMAGING — US US PELVIS COMPLETE
1 series · 14 of 14 positions shown · non-contrast
Comparison: None.

CLINICAL DATA: Pelvic pain

EXAM:
LIMITED ULTRASOUND OF PELVIS
TECHNIQUE: Attempt was made to perform transabdominal ultrasound examination of
the pelvis. Patient was unable to hold urine in the bladder to allow
for an acoustic window. The patient refused to permit transvaginal
study.

[Series 1: us pelvis complete · 0.20mm/px · 14 of 14 slices shown]
[im 1/14]
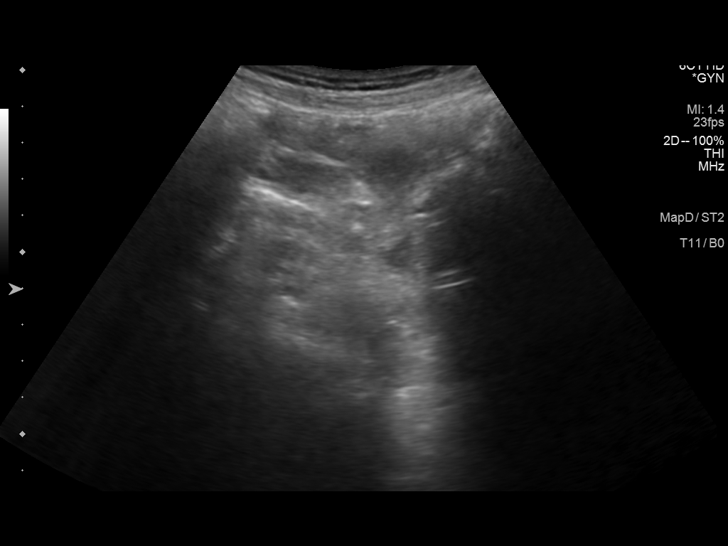
[im 2/14]
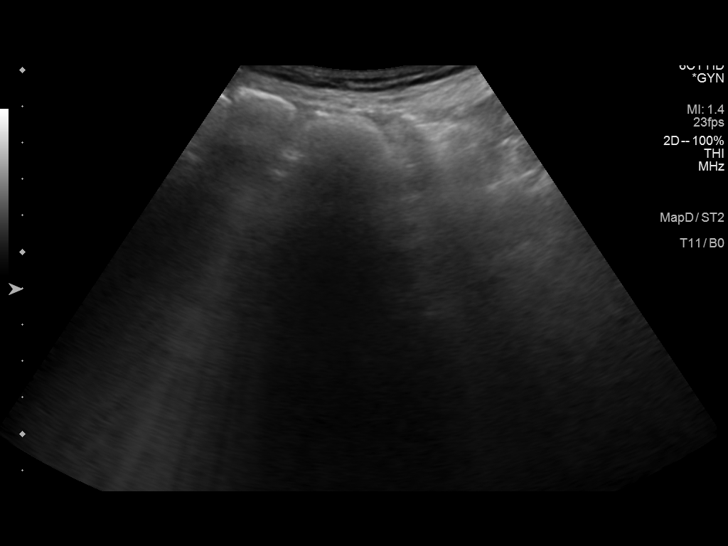
[im 3/14]
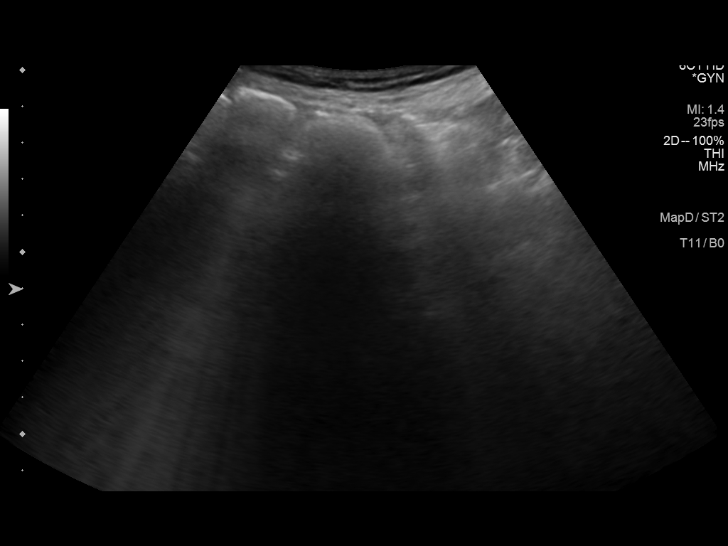
[im 4/14]
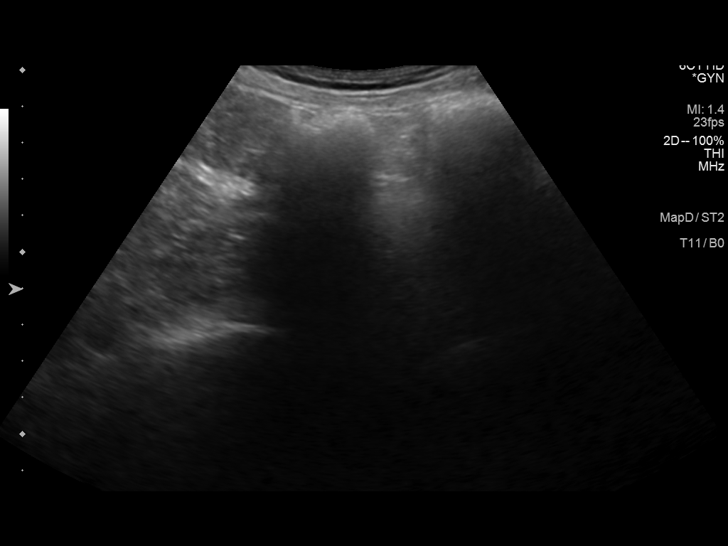
[im 5/14]
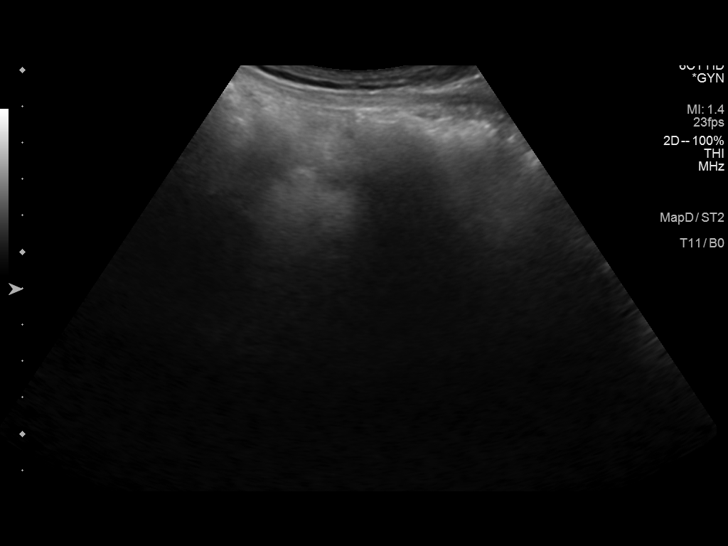
[im 6/14]
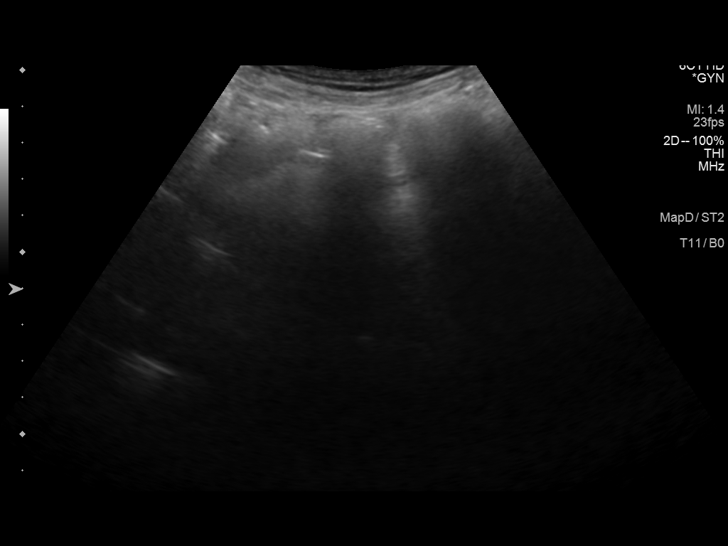
[im 7/14]
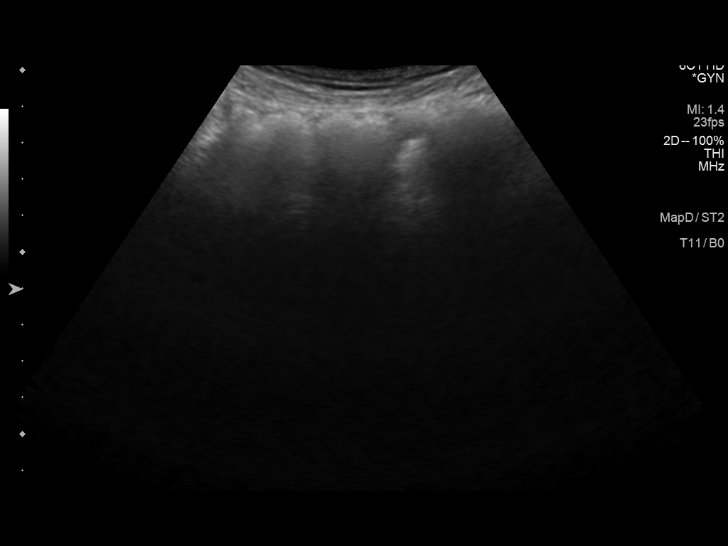
[im 8/14]
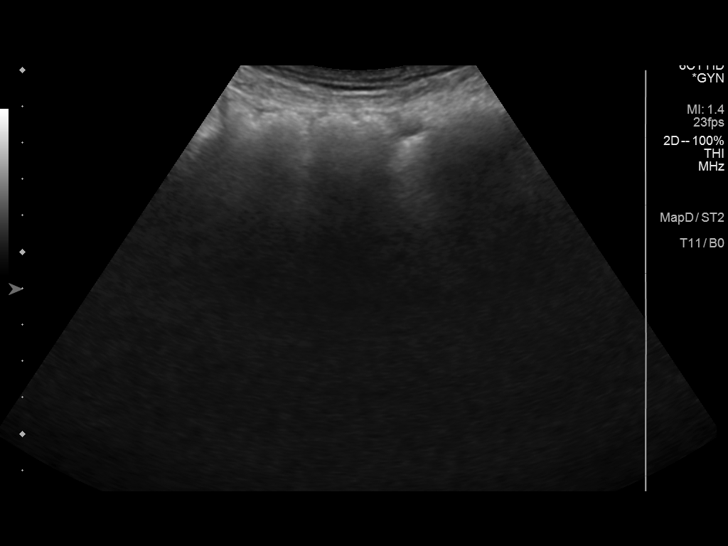
[im 9/14]
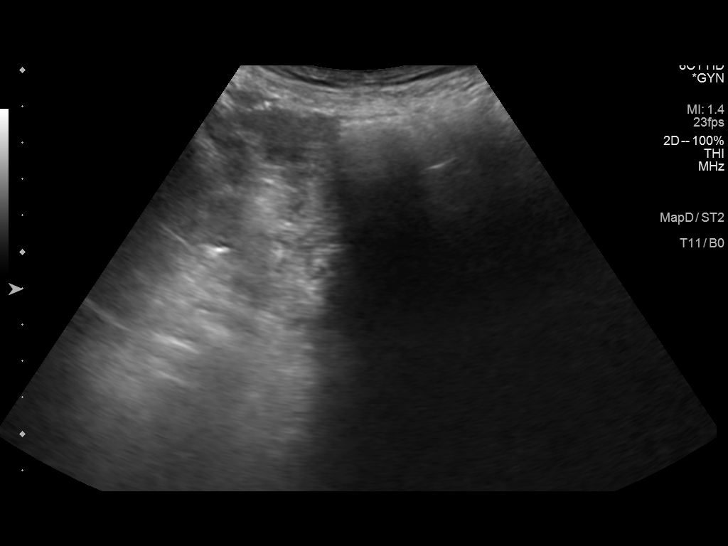
[im 10/14]
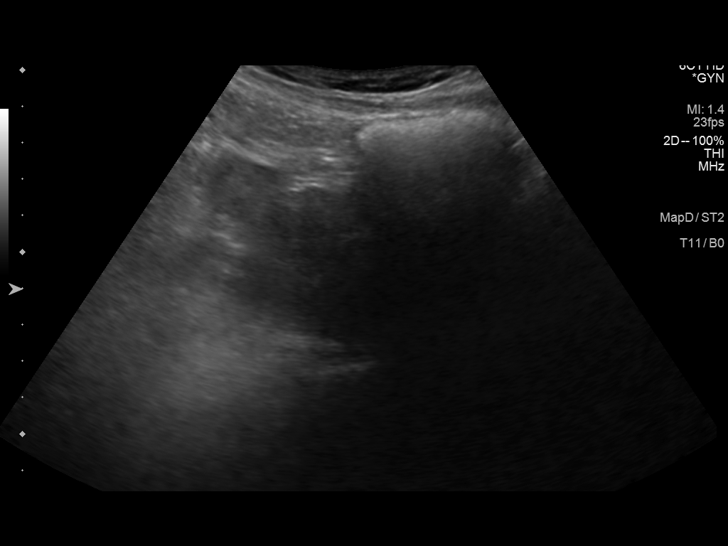
[im 11/14]
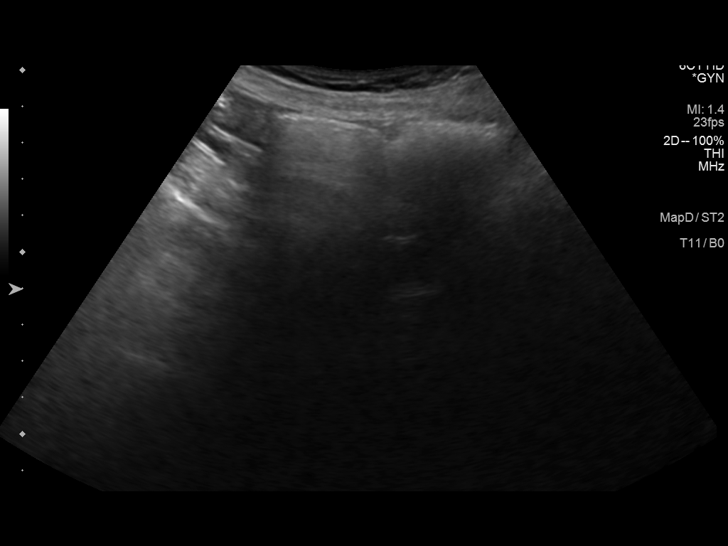
[im 12/14]
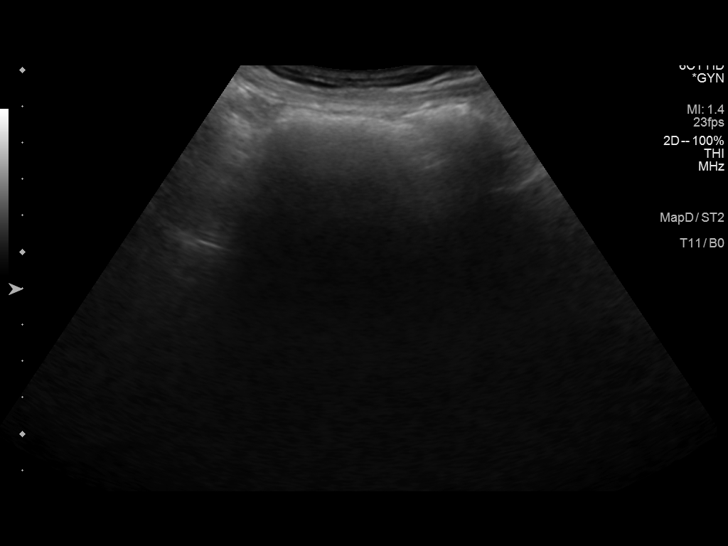
[im 13/14]
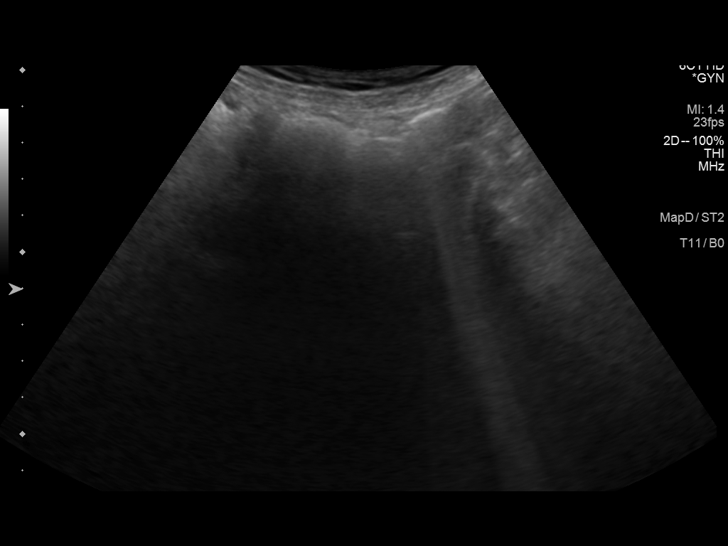
[im 14/14]
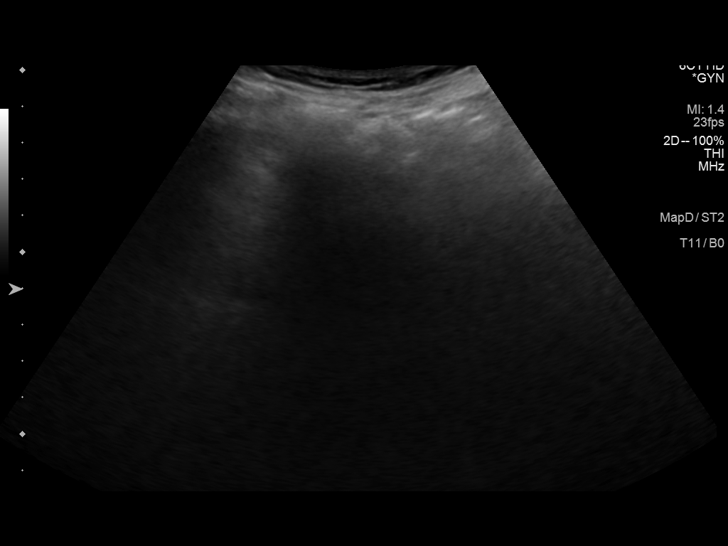

[14 of 14 positions shown; findings below may reference images not displayed]

FINDINGS: Uterus and ovaries could not be visualized due to overlying gas and
inability of patient to for urine within her bladder to provide for
acoustic window. No obvious pelvic mass or fluid evident.
IMPRESSION: Limited and nondiagnostic study due to extensive bowel gas and
patient inability to hold urine bladder. Patient refused to allow
for transvaginal study. No large mass or fluid seen.

Given this circumstance, it may be reasonable to consider CT or MR
of the pelvis for assessment of uterus and ovaries. MR would be the
optimal study in this circumstance if patient can cooperate for
pelvic MR.

## 2018-09-05 ENCOUNTER — Other Ambulatory Visit: Payer: Self-pay | Admitting: Family Medicine

## 2018-10-05 ENCOUNTER — Ambulatory Visit: Payer: BC Managed Care – PPO | Admitting: Family Medicine

## 2018-11-09 ENCOUNTER — Ambulatory Visit: Payer: BC Managed Care – PPO | Admitting: Family Medicine

## 2018-12-13 ENCOUNTER — Ambulatory Visit (INDEPENDENT_AMBULATORY_CARE_PROVIDER_SITE_OTHER): Payer: BC Managed Care – PPO | Admitting: Family Medicine

## 2018-12-13 ENCOUNTER — Encounter: Payer: Self-pay | Admitting: Family Medicine

## 2018-12-13 VITALS — Temp 96.8°F | Ht 63.0 in | Wt 111.0 lb

## 2018-12-13 DIAGNOSIS — R7301 Impaired fasting glucose: Secondary | ICD-10-CM

## 2018-12-13 DIAGNOSIS — Z1159 Encounter for screening for other viral diseases: Secondary | ICD-10-CM | POA: Diagnosis not present

## 2018-12-13 DIAGNOSIS — R61 Generalized hyperhidrosis: Secondary | ICD-10-CM

## 2018-12-13 DIAGNOSIS — K219 Gastro-esophageal reflux disease without esophagitis: Secondary | ICD-10-CM

## 2018-12-13 DIAGNOSIS — K581 Irritable bowel syndrome with constipation: Secondary | ICD-10-CM

## 2018-12-13 NOTE — Progress Notes (Signed)
Virtual Visit via Video Note  I connected with Kathleen Beck on 12/13/18 at  1:20 PM EDT by a video enabled telemedicine application and verified that I am speaking with the correct person using two identifiers.   I discussed the limitations of evaluation and management by telemedicine and the availability of in person appointments. The patient expressed understanding and agreed to proceed.  Pt was at home and I was in my office for the virtual visit.     Subjective:    CC: IBS.   HPI:  F/U IBS - she is no longer on the amitriptyline.  She cut back on her portions.  She says that has really helped. She has been eating more fruits and veggies.  Her reflux is much better tood.    Impaired fasting glucose-no increased thirst or urination. No symptoms consistent with hypoglycemia.  Still having the menopausal night sweats. Once a week disrupts her sleep.   Past medical history, Surgical history, Family history not pertinant except as noted below, Social history, Allergies, and medications have been entered into the medical record, reviewed, and corrections made.   Review of Systems: No fevers, chills, night sweats, weight loss, chest pain, or shortness of breath.   Objective:    General: Speaking clearly in complete sentences without any shortness of breath.  Alert and oriented x3.  Normal judgment. No apparent acute distress.  Well-groomed.    Impression and Recommendations:   IBS - she is off medication and is doing really well.    IFG - she wants to do a drive by T3M. She is doing well with diet.   GERD-symptoms significantly improved since changing her diet and decreasing portion size.  In fact she is off of her Tagamet.  Colon Cancer Screening - will order cologuard.   Night sweats from menopause. Discussed herbal supplements.    Discussed Hep C screening.  She declines today but says she will think about it for the future.    I discussed the assessment and treatment  plan with the patient. The patient was provided an opportunity to ask questions and all were answered. The patient agreed with the plan and demonstrated an understanding of the instructions.   The patient was advised to call back or seek an in-person evaluation if the symptoms worsen or if the condition fails to improve as anticipated.   Nani Gasser, MD

## 2018-12-26 ENCOUNTER — Telehealth: Payer: Self-pay

## 2018-12-26 DIAGNOSIS — Z1211 Encounter for screening for malignant neoplasm of colon: Secondary | ICD-10-CM

## 2018-12-26 NOTE — Telephone Encounter (Signed)
Pt called wondering if Cologuard was ordered. Per last visit it was noted "Colon Cancer Screening - will order cologuard. ".   I will place order and fax information   Order faxed, pt advised

## 2019-01-09 ENCOUNTER — Encounter: Payer: Self-pay | Admitting: Family Medicine

## 2019-01-09 ENCOUNTER — Ambulatory Visit (INDEPENDENT_AMBULATORY_CARE_PROVIDER_SITE_OTHER): Payer: BC Managed Care – PPO | Admitting: Family Medicine

## 2019-01-09 DIAGNOSIS — R7301 Impaired fasting glucose: Secondary | ICD-10-CM

## 2019-01-09 LAB — POCT GLYCOSYLATED HEMOGLOBIN (HGB A1C): Hemoglobin A1C: 5.6 % (ref 4.0–5.6)

## 2019-01-09 NOTE — Progress Notes (Signed)
Agree with documentation as above.  A1c in the normal range. Recheck in 6-12 months.   Beatrice Lecher, MD

## 2019-01-09 NOTE — Progress Notes (Signed)
Drive through nurse visit completed . Labs only. Pt advised of results.

## 2019-01-15 LAB — COLOGUARD: Cologuard: NEGATIVE

## 2019-01-16 ENCOUNTER — Telehealth: Payer: Self-pay | Admitting: Family Medicine

## 2019-01-16 NOTE — Telephone Encounter (Signed)
Pt advised.

## 2019-01-16 NOTE — Telephone Encounter (Signed)
Call patient: Cologuard is negative.  Recommend repeat colon cancer screening in 3 years at age 58.

## 2019-01-17 ENCOUNTER — Encounter: Payer: Self-pay | Admitting: Family Medicine

## 2019-02-01 DIAGNOSIS — Z83438 Family history of other disorder of lipoprotein metabolism and other lipidemia: Secondary | ICD-10-CM | POA: Insufficient documentation

## 2019-09-12 ENCOUNTER — Telehealth: Payer: Self-pay | Admitting: Family Medicine

## 2019-09-12 NOTE — Telephone Encounter (Signed)
Called patient and notified to proceed to get COVID vaccine- LM on VM. KG LPN

## 2019-09-12 NOTE — Telephone Encounter (Signed)
Yes, I would recommend that she get the COVID  vaccine.  Please add flu to her allegy list. Thank you

## 2019-09-12 NOTE — Telephone Encounter (Signed)
Patient calls and states that she wants to know if she can get the COVID vaccine.  She had a severe reaction to the flu shot.  Please advise. KG LPN

## 2020-04-01 ENCOUNTER — Telehealth: Payer: Self-pay

## 2020-04-01 NOTE — Telephone Encounter (Signed)
Pt called stating she has been having a runny nose with congestion, facial pressure and sneezing. Pt was tested for Covid on 03/31/20. Rapid results were negative. She has returned to work. Pt is fully vaccinated. She wanted to know if she should get re-tested? Requesting if provider can send in a mild decongestant to help relieve her symptoms. Pls advise, thanks.

## 2020-04-02 NOTE — Telephone Encounter (Signed)
Task completed. Left a detailed vm msg for patient regarding provider's recommendations. Direct call back info provided.

## 2020-04-02 NOTE — Telephone Encounter (Signed)
If she still having symptoms then I would recommend getting retested just to confirm she can either do a second rapid.  Sometimes they can initially be negative and then a couple days later turn positive or get a PCR test done at [facility] etc.  Also there are no currently prescription strength decongestants on the market they are all over-the-counter.  She can use a product like Sudafed.

## 2020-04-06 ENCOUNTER — Telehealth (INDEPENDENT_AMBULATORY_CARE_PROVIDER_SITE_OTHER): Payer: BC Managed Care – PPO | Admitting: Nurse Practitioner

## 2020-04-06 ENCOUNTER — Encounter: Payer: Self-pay | Admitting: Nurse Practitioner

## 2020-04-06 VITALS — Temp 98.1°F

## 2020-04-06 DIAGNOSIS — J0110 Acute frontal sinusitis, unspecified: Secondary | ICD-10-CM | POA: Diagnosis not present

## 2020-04-06 MED ORDER — AMOXICILLIN-POT CLAVULANATE 875-125 MG PO TABS: 1.0000 | ORAL_TABLET | Freq: Two times a day (BID) | ORAL | 0 refills | Status: DC

## 2020-04-06 NOTE — Progress Notes (Signed)
Virtual Video Visit via MyChart Note  I connected with  Kathleen Beck on 04/06/20 at  4:10 PM EDT by the video enabled telemedicine application for , MyChart, and verified that I am speaking with the correct person using two identifiers.   I introduced myself as a Publishing rights manager with the practice. We discussed the limitations of evaluation and management by telemedicine and the availability of in person appointments. The patient expressed understanding and agreed to proceed.  The patient is: at home I am: In the office  Subjective:    CC:  Chief Complaint  Patient presents with  . Allergies    onset 1 week ago, ST, insomnia, itchy eyes, congestion, HA, runny nose, sneezing, fatigue, lots of communication with Dr. Linford Arnold last week, tested negaive for COVID on 04/03/2020, her employer is requiring a note to return to work, has been taking Sudafed, Tylenol alternating with Advil, and using Afarin with minimal benefit    HPI: Kathleen Beck is a 59 y.o. y/o female presenting via MyChart today for one week of sore throat, itchy/watery eyes, congestion, frontal headache, rhinorrhea, sneezing, and fatigue. She has had two COVID tests, one on Tuesday 09/21 and another on Friday 09/24, both results were negative.   She has been using refresh eye drops, Sudafed, and Afrin along with Ibuprofen and Tylenol to help with her symptoms. She reports her symptoms have improved somewhat, but the frontal headache and pressure are still present daily and do not seem to be improving.   She reports that she will need a note to return to work given the negative COVID results.   Past medical history, Surgical history, Family history not pertinant except as noted below, Social history, Allergies, and medications have been entered into the medical record, reviewed, and corrections made.   Review of Systems:  See HPI for pertinent positive and negatives  Objective:    General: Speaking clearly in  complete sentences without any shortness of breath.   Alert and oriented x3.   Normal judgment.  No apparent acute distress.   Impression and Recommendations:    1. Acute non-recurrent frontal sinusitis Symptoms and presentation consistent with acute frontal sinusitis. It does appear that she may also be experiencing some allergy related symptoms as well. Both Covid test were negative first on Tuesday of last week (9/21) second on Friday of last week (9/24). Considering the length of time symptoms have been present in the recurrent headache we will treat with 5 days of Augmentin. Recommendations for symptom management provided to patient. Return to work note provided to patient through MyChart. Follow-up if symptoms worsen or fail to improve. - amoxicillin-clavulanate (AUGMENTIN) 875-125 MG tablet; Take 1 tablet by mouth 2 (two) times daily.  Dispense: 10 tablet; Refill: 0      I discussed the assessment and treatment plan with the patient. The patient was provided an opportunity to ask questions and all were answered. The patient agreed with the plan and demonstrated an understanding of the instructions.   The patient was advised to call back or seek an in-person evaluation if the symptoms worsen or if the condition fails to improve as anticipated.  I provided 20 minutes of non-face-to-face interaction with this MYCHART visit including intake, same-day documentation, and chart review.   Tollie Eth, NP

## 2020-04-06 NOTE — Patient Instructions (Addendum)
The following information is provided as a Counsellor for ADULT patients only and does NOT take into account PREGNANCY, ALLERGIES, LIVER CONDITIONS, KIDNEY CONDITIONS, GASTROINTESTINAL CONDITIONS, OR PRESCRIPTION MEDICATION INTERACTIONS. Please be sure to ask your provider if the following are safe to take with your specific medical history, conditions, or current medication regimen if you are unsure.   Adult Basic Symptom Management for Sinusitis  Congestion: Guaifenesin (Mucinex)- follow directions on packaging with a maximum dose of 2400mg  in a 24 hour period.  Pain/Fever: Ibuprofen 200mg  - 400mg  every 4-6 hours as needed (MAX 1200mg  in a 24 hour period) Pain/Fever: Tylenol 500mg  -1000mg  every 6-8 hours as needed (MAX 3000mg  in a 24 hour period)  Cough: Dextromethorphan (Delsym)- follow directions on packing with a maximum dose of 120mg  in a 24 hour period.  Nasal Stuffiness: Saline nasal spray and/or Nettie Pot with sterile saline solution Sudafed can also be taken - I recommend the one you must get the pharmacy counter.   Runny Nose: Fluticasone nasal spray (Flonase) OR Mometasone nasal spray (Nasonex) OR Triamcinolone Acetonide nasal spray (Nasacort)- follow directions on the packaging  Pain/Pressure: Warm washcloth to the face  Sore Throat: Warm salt water gargles  If you have allergies, you may also consider taking an oral antihistamine (like Zyrtec or Claritin) as these may also help with your symptoms.  **Many medications will have more than one ingredient, be sure you are reading the packaging carefully and not taking more than one dose of the same kind of medication at the same time or too close together. It is OK to use formulas that have all of the ingredients you want, but do not take them in a combined medication and as separate dose too close together. If you have any questions, the pharmacist will be happy to help you decide what is safe.

## 2020-11-02 ENCOUNTER — Telehealth: Payer: Self-pay | Admitting: *Deleted

## 2020-11-02 ENCOUNTER — Other Ambulatory Visit: Payer: Self-pay | Admitting: *Deleted

## 2020-11-02 DIAGNOSIS — J0110 Acute frontal sinusitis, unspecified: Secondary | ICD-10-CM

## 2020-11-02 NOTE — Telephone Encounter (Signed)
Pt called and wanted to know if Dr. Linford Arnold thoughts about her starting Fish oil 1200 mg daily and [address] Wart 500 mg daily. Did not indicate why she wanted to take.   Pt is ok with detailed vm if she doesn't pick up phone.

## 2020-11-02 NOTE — Telephone Encounter (Signed)
Called pt and she  Wanted to start the fish oil for dry eyes. The [place] wart for her mood.  Pt advised that these supplements should be fine for her to take. Will fwd to pcp for advice.

## 2020-11-02 NOTE — Telephone Encounter (Signed)
.    These are fine.  If she still struggling with her mood I will be happy to do an office visit with her to discuss further.

## 2020-11-04 NOTE — Telephone Encounter (Signed)
LVM w/recommendations

## 2020-12-31 LAB — HM MAMMOGRAPHY

## 2021-04-08 ENCOUNTER — Ambulatory Visit: Payer: BC Managed Care – PPO | Admitting: Physician Assistant

## 2021-04-08 ENCOUNTER — Encounter: Payer: Self-pay | Admitting: Physician Assistant

## 2021-04-08 ENCOUNTER — Other Ambulatory Visit: Payer: Self-pay

## 2021-04-08 VITALS — BP 97/65 | HR 102 | Ht 63.0 in | Wt 121.0 lb

## 2021-04-08 DIAGNOSIS — F439 Reaction to severe stress, unspecified: Secondary | ICD-10-CM

## 2021-04-08 DIAGNOSIS — Z131 Encounter for screening for diabetes mellitus: Secondary | ICD-10-CM

## 2021-04-08 DIAGNOSIS — F419 Anxiety disorder, unspecified: Secondary | ICD-10-CM

## 2021-04-08 DIAGNOSIS — K582 Mixed irritable bowel syndrome: Secondary | ICD-10-CM | POA: Diagnosis not present
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
## 2021-04-08 NOTE — Patient Instructions (Addendum)
Probiotic Cultrelle.   [employer] Animator and/or zoloft.   Diet for Irritable Bowel Syndrome When you have irritable bowel syndrome (IBS), it is very important to eat the foods and follow the eating habits that are best for your condition. IBS may cause various symptoms such as pain in the abdomen, constipation, or diarrhea. Choosing the right foods can help to ease the discomfort from these symptoms. Work with your health care provider and diet and nutrition specialist (dietitian) to find the eating plan that will help to control your symptoms. What are tips for following this plan?   Keep a food diary. This will help you identify foods that cause symptoms. Write down: What you eat and when you eat it. What symptoms you have. When symptoms occur in relation to your meals, such as "pain in abdomen 2 hours after dinner." Eat your meals slowly and in a relaxed setting. Aim to eat 5-6 small meals per day. Do not skip meals. Drink enough fluid to keep your urine pale yellow. Ask your health care provider if you should take an over-the-counter probiotic to help restore healthy bacteria in your gut (digestive tract). Probiotics are foods that contain good bacteria and yeasts. Your dietitian may have specific dietary recommendations for you based on your symptoms. He or she may recommend that you: Avoid foods that cause symptoms. Talk with your dietitian about other ways to get the same nutrients that are in those problem foods. Avoid foods with gluten. Gluten is a protein that is found in rye, wheat, and barley. Eat more foods that contain soluble fiber. Examples of foods with high soluble fiber include oats, seeds, and certain fruits and vegetables. Take a fiber supplement if directed by your dietitian. Reduce or avoid certain foods called FODMAPs. These are foods that contain carbohydrates that are hard to digest. Ask your doctor which foods contain these  carbohydrates. What foods are not recommended? The following are some foods and drinks that may make your symptoms worse: Fatty foods, such as french fries. Foods that contain gluten, such as pasta and cereal. Dairy products, such as milk, cheese, and ice cream. Chocolate. Alcohol. Products with caffeine, such as coffee. Carbonated drinks, such as soda. Foods that are high in FODMAPs. These include certain fruits and vegetables. Products with sweeteners such as honey, high fructose corn syrup, sorbitol, and mannitol. The items listed above may not be a complete list of foods and beverages you should avoid. Contact a dietitian for more information. What foods are good sources of fiber? Your health care provider or dietitian may recommend that you eat more foods that contain fiber. Fiber can help to reduce constipation and other IBS symptoms. Add foods with fiber to your diet a little at a time so your body can get used to them. Too much fiber at one time might cause gas and swelling of your abdomen. The following are some foods that are good sources of fiber: Berries, such as raspberries, strawberries, and blueberries. Tomatoes. Carrots. Brown rice. Oats. Seeds, such as chia and pumpkin seeds. The items listed above may not be a complete list of recommended sources of fiber. Contact your dietitian for more options. Where to find more information International Foundation for Functional Gastrointestinal Disorders: www.iffgd.[employer] of Diabetes and Digestive and Kidney Diseases: CarFlippers.tn Summary When you have irritable bowel syndrome (IBS), it is very important to eat the foods and follow the eating habits that are best for your condition. IBS  may cause various symptoms such as pain in the abdomen, constipation, or diarrhea. Choosing the right foods can help to ease the discomfort that comes from symptoms. Keep a food diary. This will help you identify foods that  cause symptoms. Your health care provider or diet and nutrition specialist (dietitian) may recommend that you eat more foods that contain fiber. This information is not intended to replace advice given to you by your health care provider. Make sure you discuss any questions you have with your health care provider. Document Revised: 02/27/2020 Document Reviewed: 02/27/2020 Elsevier Patient Education  2022 [employer].

## 2021-04-09 LAB — COMPLETE METABOLIC PANEL WITH GFR
AG Ratio: 1.7 (calc) (ref 1.0–2.5)
ALT: 18 U/L (ref 6–29)
AST: 21 U/L (ref 10–35)
Albumin: 4.2 g/dL (ref 3.6–5.1)
Alkaline phosphatase (APISO): 51 U/L (ref 37–153)
BUN: 17 mg/dL (ref 7–25)
CO2: 29 mmol/L (ref 20–32)
Calcium: 10.1 mg/dL (ref 8.6–10.4)
Chloride: 102 mmol/L (ref 98–110)
Creat: 0.77 mg/dL (ref 0.50–1.03)
Globulin: 2.5 g/dL (calc) (ref 1.9–3.7)
Glucose, Bld: 66 mg/dL (ref 65–99)
Potassium: 4.7 mmol/L (ref 3.5–5.3)
Sodium: 140 mmol/L (ref 135–146)
Total Bilirubin: 0.5 mg/dL (ref 0.2–1.2)
Total Protein: 6.7 g/dL (ref 6.1–8.1)
eGFR: 89 mL/min/{1.73_m2} (ref >=60)

## 2021-04-09 LAB — HEMOGLOBIN A1C
Hgb A1c MFr Bld: 5.5 % of total Hgb (ref <5.7)
Mean Plasma Glucose: 111 mg/dL
eAG (mmol/L): 6.2 mmol/L

## 2021-04-09 LAB — TSH: TSH: 1.8 mIU/L (ref 0.40–4.50)

## 2021-04-09 NOTE — Progress Notes (Signed)
Kathleen Beck,   Your A1C is still in normal range. Pre-diabetic range starts at 5.7.  Kidney, liver, glucose look great.  Thyroid looks great.

## 2021-04-11 ENCOUNTER — Encounter: Payer: Self-pay | Admitting: Physician Assistant

## 2021-04-12 ENCOUNTER — Encounter: Payer: Self-pay | Admitting: Physician Assistant

## 2021-04-18 ENCOUNTER — Encounter: Payer: Self-pay | Admitting: Family Medicine

## 2021-04-18 DIAGNOSIS — Z13220 Encounter for screening for lipoid disorders: Secondary | ICD-10-CM

## 2021-04-23 ENCOUNTER — Telehealth (INDEPENDENT_AMBULATORY_CARE_PROVIDER_SITE_OTHER): Payer: BC Managed Care – PPO | Admitting: Family Medicine

## 2021-04-23 ENCOUNTER — Encounter: Payer: Self-pay | Admitting: Family Medicine

## 2021-04-23 DIAGNOSIS — F439 Reaction to severe stress, unspecified: Secondary | ICD-10-CM | POA: Diagnosis not present

## 2021-04-23 DIAGNOSIS — G479 Sleep disorder, unspecified: Secondary | ICD-10-CM

## 2021-04-23 DIAGNOSIS — K582 Mixed irritable bowel syndrome: Secondary | ICD-10-CM | POA: Diagnosis not present

## 2021-04-23 DIAGNOSIS — F4321 Adjustment disorder with depressed mood: Secondary | ICD-10-CM | POA: Diagnosis not present

## 2021-04-23 LAB — LIPID PANEL W/REFLEX DIRECT LDL
Cholesterol: 201 mg/dL — ABNORMAL HIGH (ref <200)
HDL: 86 mg/dL (ref >=50)
LDL Cholesterol (Calc): 100 mg/dL (calc) — ABNORMAL HIGH
Non-HDL Cholesterol (Calc): 115 mg/dL (calc) (ref <130)
Total CHOL/HDL Ratio: 2.3 (calc) (ref <5.0)
Triglycerides: 65 mg/dL (ref <150)

## 2021-04-23 NOTE — Assessment & Plan Note (Signed)
Okay to continue with small dose of doxylamine since it seems to be working well.  We also discussed really trying to get her sleep pattern back on schedule so she has a consistent bedtime and setting some small goals for herself.

## 2021-04-23 NOTE — Progress Notes (Signed)
Hi Kathleen Beck, LDL cholesterol is right on the border.  Just continue to work on healthy diet and regular exercise.  Also please let us know you gets her Pap smears completed.  We do not have an up-to-date 1 on file for you.

## 2021-04-23 NOTE — Telephone Encounter (Signed)
Yes ok.  Just remember Mag can move your bowels move so if causing diarrhea pls stop

## 2021-04-23 NOTE — Progress Notes (Signed)
Virtual Visit via Video Note  I connected with Kathleen Beck on 04/23/21 at  2:40 PM EDT by a video enabled telemedicine application and verified that I am speaking with the correct person using two identifiers.   I discussed the limitations of evaluation and management by telemedicine and the availability of in person appointments. The patient expressed understanding and agreed to proceed.  Patient location: at home Provider location: in office  Subjective:    CC: Stress  HPI:  Pt reports that she is continuing to experience the same sxs that she saw [facility] for. She has been under a lot of stress and this has caused her sxs of IBS, anxiety ect to flare. More constipation than loose stools.   Taking supplements to help with her bloating. She stated that she had to miss work today due to lack of sleep. She wanted to discuss doing a magnesium cream vs an oral. And her labs. She stated her mother recently had a stroke and is concerned about that.     She spoke with the wellness coach and started exercising, and drinking more water. She just reports that she has stress.husband changing jobs.   Nephew committed suicidal last fall.  Then found out her aunt has BrCa, and mother in law passed from colon ca a few months ago.  Son left for [employer]. Her mother had a stroke recently. Sister in law dx with brain Ca in Jan.    Melatonin helps with sleep some. Staying up late to grade papers,  and can fall asleep,  but then wide awake by 5AM.  He is feeling sleep deprived.  Has been using a quarter tab of doxylamine and that seems to work really well.  When she was taking a whole tab she felt like it was really impacting her mood.  Having gained about 6-7 lbs this summer.     Past medical history, Surgical history, Family history not pertinant except as noted below, Social history, Allergies, and medications have been entered into the medical record, reviewed, and corrections made.    Objective:     General: Speaking clearly in complete sentences without any shortness of breath.  Alert and oriented x3.  Normal judgment. No apparent acute distress.    Impression and Recommendations:    Irritable bowel syndrome with both constipation and diarrhea I think a lot of her IBS symptoms are being triggered by stress right now.  Discussed diet, exercise and working on sleep quality.  She is interested in trying magnesium.  We did discuss that it can sometimes cause more loose stools and she is struggling with that.  But it may be worth trying to see if she feels like it is helpful.  We also discussed cutting milk products out she does drink a cup of milk and coffee every morning we also discussed maybe cutting back on caffeine which can be a stimulant for the bowel as well and switching maybe to decaf and see if that helps.  Grief Encouraged her to consider Grief counselor.  This has been an incredibly stressful year.   Sleep disturbance Okay to continue with small dose of doxylamine since it seems to be working well.  We also discussed really trying to get her sleep pattern back on schedule so she has a consistent bedtime and setting some small goals for herself.  Reminded her that she is due for her Pap smear. With [facility]  No orders of the defined types were placed in  this encounter.   No orders of the defined types were placed in this encounter.    I discussed the assessment and treatment plan with the patient. The patient was provided an opportunity to ask questions and all were answered. The patient agreed with the plan and demonstrated an understanding of the instructions.   The patient was advised to call back or seek an in-person evaluation if the symptoms worsen or if the condition fails to improve as anticipated.   Beatrice Lecher, MD

## 2021-04-23 NOTE — Assessment & Plan Note (Signed)
Encouraged her to consider Grief counselor.  This has been an incredibly stressful year.

## 2021-04-23 NOTE — Progress Notes (Signed)
Pt reports that she is continuing to experience the same sxs that she saw [facility] for. She has been under a lot of stress and this has caused her sxs of IBS, anxiety ect to flare. Taking supplements to help with her bloating. She stated that she had to miss work today due to lack of sleep.   She wanted to discuss doing a magnesium cream vs an oral. And her labs. She stated her mother recently had a stroke and is concerned about how to   She spoke with the wellness coach and started exercising, and drinking more water. She just reports that she has stress.

## 2021-04-23 NOTE — Assessment & Plan Note (Addendum)
I think a lot of her IBS symptoms are being triggered by stress right now.  Discussed diet, exercise and working on sleep quality.  She is interested in trying magnesium.  We did discuss that it can sometimes cause more loose stools and she is struggling with that.  But it may be worth trying to see if she feels like it is helpful.  We also discussed cutting milk products out she does drink a cup of milk and coffee every morning we also discussed maybe cutting back on caffeine which can be a stimulant for the bowel as well and switching maybe to decaf and see if that helps.

## 2021-04-28 ENCOUNTER — Encounter: Payer: Self-pay | Admitting: Family Medicine

## 2021-04-30 ENCOUNTER — Ambulatory Visit: Payer: Self-pay | Admitting: Family Medicine

## 2021-05-03 ENCOUNTER — Encounter: Payer: Self-pay | Admitting: Family Medicine

## 2021-05-03 NOTE — Telephone Encounter (Signed)
Colace is just a softener its not a laxative so really should not cause diarrhea.  But she could certainly drop down to 1 instead of 2 and see if that helps.

## 2021-05-21 ENCOUNTER — Ambulatory Visit: Payer: BC Managed Care – PPO | Admitting: Family Medicine

## 2021-05-30 ENCOUNTER — Encounter: Payer: Self-pay | Admitting: Family Medicine

## 2021-06-15 ENCOUNTER — Other Ambulatory Visit: Payer: Self-pay

## 2021-06-15 ENCOUNTER — Encounter: Payer: Self-pay | Admitting: Family Medicine

## 2021-06-15 ENCOUNTER — Ambulatory Visit: Payer: BC Managed Care – PPO | Admitting: Family Medicine

## 2021-06-15 VITALS — BP 113/68 | HR 77 | Temp 98.3°F | Resp 16 | Ht 62.0 in | Wt 127.0 lb

## 2021-06-15 DIAGNOSIS — K582 Mixed irritable bowel syndrome: Secondary | ICD-10-CM

## 2021-06-15 DIAGNOSIS — Z7689 Persons encountering health services in other specified circumstances: Secondary | ICD-10-CM | POA: Diagnosis not present

## 2021-06-15 DIAGNOSIS — Z1211 Encounter for screening for malignant neoplasm of colon: Secondary | ICD-10-CM | POA: Diagnosis not present

## 2021-06-15 NOTE — Patient Instructions (Signed)
Recommend using one of the smart phone apps such as my fitness pal or lose it to help track calories and set goals for weight loss.  I think losing a half a pound a week would be absolutely reasonable.  I be happy to make a nutrition referral for you as well.  Try to add in some resistance/weight training at least 2 to 3 days/week.

## 2021-06-15 NOTE — Assessment & Plan Note (Signed)
Visit #: 1 Starting Weight: 127 lbs.    Current weight: 127 lbs.  Previous weight: Change in weight: Goal weight: Dietary goals: We discussed using one of the smart phone apps to better set calorie goals over the next several weeks and we will place a referral to nutrition. Exercise goals: Discussed adding in some resistance/weights training 2 to 3 days a week in addition to keeping up her cardio that she is already doing 5 days/week. Medication: NA Follow-up and referrals: 8 weeks.

## 2021-06-15 NOTE — Progress Notes (Signed)
Established Patient Office Visit  Subjective:  Patient ID: Kathleen Beck, female    DOB: Mar 17, 1961  Age: 60 y.o. MRN: [id number]  CC:  Chief Complaint  Patient presents with   Discuss weight gain    Would like to discuss options to help with weight loss.    Irritable Bowel Syndrome    Follow up.     HPI Kathleen Beck presents for would like to discuss weight loss.  She says since August she has gained about 10 to 12 pounds.  And she is just feeling a little frustrated she has been under a lot of stress recently and feels like she has been doing some stress eating.  But she is also been exercising 5 to 7 days/week mostly doing walking and occasional jogging.  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  She would like some assistance with trying to lose weight.  Guards to follow-up for her irritable bowel syndrome she says it has been getting a little bit better she usually drink a little bit of green tea and use a warm compress and that seems to help most of the time but she still having a lot of bloating.  She has never seen GI and has never had a colonoscopy.   No past medical history on file.  Past Surgical History:  Procedure Laterality Date   CESAREAN SECTION     DILATION AND CURETTAGE OF UTERUS     TONSILLECTOMY      Family History  Problem Relation Age of Onset   Hyperlipidemia Mother    Cancer Father        prostate   ALS Father    Hyperlipidemia Other     Social History   Socioeconomic History   Marital status: Married    Spouse name: Not on file   Number of children: Not on file   Years of education: Not on file   Highest education level: Not on file  Occupational History   Not on file  Tobacco Use   Smoking status: Never   Smokeless tobacco: Never  Substance and Sexual Activity   Alcohol use: Yes   Drug use: No   Sexual activity: Not on file  Other Topics Concern   Not on file  Social History  Narrative   Not on file   Social Determinants of Health   Financial Resource Strain: Not on file  Food Insecurity: Not on file  Transportation Needs: Not on file  Physical Activity: Not on file  Stress: Not on file  Social Connections: Not on file  Intimate Partner Violence: Not on file    Outpatient Medications Prior to Visit  Medication Sig Dispense Refill   Acetaminophen (TYLENOL) 325 MG CAPS      Bismuth Subsalicylate (PEPTO-BISMOL MAX STRENGTH PO)      Calcium Carbonate Antacid (TUMS E-X 750 PO)      docusate sodium (COLACE) 100 MG capsule Take 100 mg by mouth 2 (two) times daily.     DOXYLAMINE SUCCINATE, SLEEP, PO Take by mouth.     ibuprofen (ADVIL) 200 MG tablet      Magnesium 250 MG TABS      Melatonin 10 MG CAPS      NON FORMULARY Omega 3 eye drops     Peppermint Oil (IBGARD PO)      Probiotic Product (CULTRELLE KIDS IMMUNE DEFENSE PO)  Simethicone (GAS RELIEF) 250 MG CAPS      No facility-administered medications prior to visit.    Allergies  Allergen Reactions   Influenza Vaccine Live Anaphylaxis, Anxiety, Dermatitis, Hives, Itching, Palpitations, Rash, Shortness Of Breath and Swelling   Guaifenesin     Other reaction(s): Unknown   Thimerosal Swelling   Allegra-D [Fexofenadine-Pseudoephed Er] Other (See Comments)    Headache, insomnia   Entex La     ROS Review of Systems    Objective:    Physical Exam Constitutional:      Appearance: Normal appearance. She is well-developed.  HENT:     Head: Normocephalic and atraumatic.  Cardiovascular:     Rate and Rhythm: Normal rate and regular rhythm.     Heart sounds: Normal heart sounds.  Pulmonary:     Effort: Pulmonary effort is normal.     Breath sounds: Normal breath sounds.  Skin:    General: Skin is warm and dry.  Neurological:     Mental Status: She is alert and oriented to person, place, and time.  Psychiatric:        Behavior: Behavior normal.    BP 113/68   Pulse 77   Temp 98.3 F  (36.8 C)   Resp 16   Ht $R'5\' 2"'xo$  (1.575 m)   Wt 127 lb (57.6 kg)   SpO2 100%   BMI 23.23 kg/m  Wt Readings from Last 3 Encounters:  06/15/21 127 lb (57.6 kg)  04/08/21 121 lb (54.9 kg)  12/13/18 111 lb (50.3 kg)     Health Maintenance Due  Topic Date Due   HIV Screening  Never done   Hepatitis C Screening  Never done   PAP SMEAR-Modifier  06/16/2020    There are no preventive care reminders to display for this patient.  Lab Results  Component Value Date   TSH 1.80 04/08/2021   Lab Results  Component Value Date   WBC 4.2 04/09/2018   HGB 13.7 04/09/2018   HCT 41.6 04/09/2018   MCV 87.6 04/09/2018   PLT 201 04/09/2018   Lab Results  Component Value Date   NA 140 04/08/2021   K 4.7 04/08/2021   CO2 29 04/08/2021   GLUCOSE 66 04/08/2021   BUN 17 04/08/2021   CREATININE 0.77 04/08/2021   BILITOT 0.5 04/08/2021   ALKPHOS 48 11/04/2015   AST 21 04/08/2021   ALT 18 04/08/2021   PROT 6.7 04/08/2021   ALBUMIN 4.5 09/03/2009   CALCIUM 10.1 04/08/2021   EGFR 89 04/08/2021   Lab Results  Component Value Date   CHOL 201 (H) 04/22/2021   Lab Results  Component Value Date   HDL 86 04/22/2021   Lab Results  Component Value Date   LDLCALC 100 (H) 04/22/2021   Lab Results  Component Value Date   TRIG 65 04/22/2021   Lab Results  Component Value Date   CHOLHDL 2.3 04/22/2021   Lab Results  Component Value Date   HGBA1C 5.5 04/08/2021      Assessment & Plan:   Problem List Items Addressed This Visit       Digestive   Irritable bowel syndrome with both constipation and diarrhea - Primary    She is doing better as she has been able to manage her stress levels a little bit better and she has been exercising regularly.  But she is also never had a screening colonoscopy and I really think she would benefit from having 1.  Just to rule out  any other abnormalities.  She is agreeable and says that she will have it done after the new year.      Relevant  Medications   docusate sodium (COLACE) 100 MG capsule   Probiotic Product (CULTRELLE KIDS IMMUNE DEFENSE PO)   Other Relevant Orders   Ambulatory referral to Gastroenterology     Other   Encounter for weight management    Visit #: 1 Starting Weight: 127 lbs.    Current weight: 127 lbs.  Previous weight: Change in weight: Goal weight: Dietary goals: We discussed using one of the smart phone apps to better set calorie goals over the next several weeks and we will place a referral to nutrition. Exercise goals: Discussed adding in some resistance/weights training 2 to 3 days a week in addition to keeping up her cardio that she is already doing 5 days/week. Medication: NA Follow-up and referrals: 8 weeks.        Relevant Orders   Amb ref to Medical Nutrition Therapy-MNT   Other Visit Diagnoses     Screen for colon cancer       Relevant Orders   Ambulatory referral to Gastroenterology       No orders of the defined types were placed in this encounter.   Follow-up: Return in about 8 weeks (around 08/10/2021) for Weight Management.  .   I spent 32 minutes on the day of the encounter to include pre-visit record review, face-to-face time with the patient and post visit ordering of test.   Beatrice Lecher, MD

## 2021-06-15 NOTE — Assessment & Plan Note (Signed)
She is doing better as she has been able to manage her stress levels a little bit better and she has been exercising regularly.  But she is also never had a screening colonoscopy and I really think she would benefit from having 1.  Just to rule out any other abnormalities.  She is agreeable and says that she will have it done after the new year.

## 2021-07-20 ENCOUNTER — Other Ambulatory Visit: Payer: Self-pay

## 2021-07-20 ENCOUNTER — Encounter: Payer: Self-pay | Admitting: Family Medicine

## 2021-07-20 ENCOUNTER — Ambulatory Visit: Payer: BC Managed Care – PPO | Admitting: Family Medicine

## 2021-07-20 VITALS — BP 90/54 | HR 87 | Temp 98.3°F | Resp 16 | Ht 62.0 in | Wt 126.0 lb

## 2021-07-20 DIAGNOSIS — J019 Acute sinusitis, unspecified: Secondary | ICD-10-CM

## 2021-07-20 MED ORDER — AMOXICILLIN 875 MG PO TABS: 875.0000 mg | ORAL_TABLET | Freq: Two times a day (BID) | ORAL | 0 refills | Status: AC

## 2021-07-20 NOTE — Progress Notes (Signed)
Acute Office Visit  Subjective:    Patient ID: Kathleen Beck, female    DOB: 12-12-1960, 61 y.o.   MRN: [id number]  Chief Complaint  Patient presents with   Cough    Chest congestion and fatigue, muscle aches since 06/30/21.     HPI Patient is in today for URI sxs.  She was initially sick around December 21 she got sick with respiratory symptoms from her son.  She says by Christmas she was starting to feel a lot better but then by Lysle Morales weekend she was feeling sick again with nasal congestion, dry cough and fatigue.  She never ran a fever this time.  But in the last couple of days she had started to experience muscle aches and that has made it more difficult for her to rest and sleep.  So she actually feels a little worse today.  She also brought in her lab bills from September and October where they did not pay any of her labs she was hoping that we might give her to resubmit some codes to see if they would cover it.  No past medical history on file.  Past Surgical History:  Procedure Laterality Date   CESAREAN SECTION     DILATION AND CURETTAGE OF UTERUS     TONSILLECTOMY      Family History  Problem Relation Age of Onset   Hyperlipidemia Mother    Cancer Father        prostate   ALS Father    Hyperlipidemia Other     Social History   Socioeconomic History   Marital status: Married    Spouse name: Not on file   Number of children: Not on file   Years of education: Not on file   Highest education level: Not on file  Occupational History   Not on file  Tobacco Use   Smoking status: Never   Smokeless tobacco: Never  Substance and Sexual Activity   Alcohol use: Yes   Drug use: No   Sexual activity: Not on file  Other Topics Concern   Not on file  Social History Narrative   Not on file   Social Determinants of Health   Financial Resource Strain: Not on file  Food Insecurity: Not on file  Transportation Needs: Not on file  Physical Activity: Not on  file  Stress: Not on file  Social Connections: Not on file  Intimate Partner Violence: Not on file    Outpatient Medications Prior to Visit  Medication Sig Dispense Refill   Acetaminophen (TYLENOL) 325 MG CAPS      Bismuth Subsalicylate (PEPTO-BISMOL MAX STRENGTH PO)      Calcium Carbonate Antacid (TUMS E-X 750 PO)      docusate sodium (COLACE) 100 MG capsule Take 100 mg by mouth 2 (two) times daily.     DOXYLAMINE SUCCINATE, SLEEP, PO Take by mouth.     ibuprofen (ADVIL) 200 MG tablet      Lactobacillus-Inulin (CULTURELLE ADULT ULT BALANCE PO) Take by mouth daily.     Magnesium 250 MG TABS      Melatonin 10 MG CAPS      NON FORMULARY Omega 3 eye drops     Peppermint Oil (IBGARD PO)      Probiotic Product (CULTRELLE KIDS IMMUNE DEFENSE PO)      Simethicone (GAS RELIEF) 250 MG CAPS      No facility-administered medications prior to visit.    Allergies  Allergen Reactions  Influenza Vaccine Live Anaphylaxis, Anxiety, Dermatitis, Hives, Itching, Palpitations, Rash, Shortness Of Breath and Swelling   Guaifenesin     Other reaction(s): Unknown   Thimerosal Swelling   Allegra-D [Fexofenadine-Pseudoephed Er] Other (See Comments)    Headache, insomnia   Entex La     Review of Systems     Objective:    Physical Exam Constitutional:      Appearance: She is well-developed.  HENT:     Head: Normocephalic and atraumatic.     Right Ear: External ear normal.     Left Ear: External ear normal.     Nose: Nose normal.  Eyes:     Conjunctiva/sclera: Conjunctivae normal.     Pupils: Pupils are equal, round, and reactive to light.  Neck:     Thyroid: No thyromegaly.  Cardiovascular:     Rate and Rhythm: Normal rate and regular rhythm.     Heart sounds: Normal heart sounds.  Pulmonary:     Effort: Pulmonary effort is normal.     Breath sounds: Normal breath sounds. No wheezing.  Musculoskeletal:     Cervical back: Neck supple.  Lymphadenopathy:     Cervical: No cervical  adenopathy.  Skin:    General: Skin is warm and dry.  Neurological:     Mental Status: She is alert and oriented to person, place, and time.    BP (!) 90/54    Pulse 87    Temp 98.3 F (36.8 C)    Resp 16    Ht _0  (1.575 m)    Wt 126 lb (57.2 kg)    SpO2 100%    BMI 23.05 kg/m  Wt Readings from Last 3 Encounters:  07/20/21 126 lb (57.2 kg)  06/15/21 127 lb (57.6 kg)  04/08/21 121 lb (54.9 kg)    Health Maintenance Due  Topic Date Due   HIV Screening  Never done   Hepatitis C Screening  Never done   PAP SMEAR-Modifier  06/16/2020    There are no preventive care reminders to display for this patient.   Lab Results  Component Value Date   TSH 1.80 04/08/2021   Lab Results  Component Value Date   WBC 4.2 04/09/2018   HGB 13.7 04/09/2018   HCT 41.6 04/09/2018   MCV 87.6 04/09/2018   PLT 201 04/09/2018   Lab Results  Component Value Date   NA 140 04/08/2021   K 4.7 04/08/2021   CO2 29 04/08/2021   GLUCOSE 66 04/08/2021   BUN 17 04/08/2021   CREATININE 0.77 04/08/2021   BILITOT 0.5 04/08/2021   ALKPHOS 48 11/04/2015   AST 21 04/08/2021   ALT 18 04/08/2021   PROT 6.7 04/08/2021   ALBUMIN 4.5 09/03/2009   CALCIUM 10.1 04/08/2021   EGFR 89 04/08/2021   Lab Results  Component Value Date   CHOL 201 (H) 04/22/2021   Lab Results  Component Value Date   HDL 86 04/22/2021   Lab Results  Component Value Date   LDLCALC 100 (H) 04/22/2021   Lab Results  Component Value Date   TRIG 65 04/22/2021   Lab Results  Component Value Date   CHOLHDL 2.3 04/22/2021   Lab Results  Component Value Date   HGBA1C 5.5 04/08/2021       Assessment & Plan:   Problem List Items Addressed This Visit   None Visit Diagnoses     Acute non-recurrent sinusitis, unspecified location    -  Primary   Relevant Medications  amoxicillin (AMOXIL) 875 MG tablet      Cute sinusitis-she has had persistent symptoms for 8 to 9 days and she has actually felt worse in the last  24 hours.  We will go ahead and treat with antibiotics.  Okay to continue symptomatic care.  If not feeling a little better by the end of the week then please give [name] call back.  If we can try to resubmit some updated codes for her labs from 04-05-23 and October.  Meds ordered this encounter  Medications   amoxicillin (AMOXIL) 875 MG tablet    Sig: Take 1 tablet (875 mg total) by mouth 2 (two) times daily for 10 days.    Dispense:  14 tablet    Refill:  0     Beatrice Lecher, MD

## 2021-07-20 NOTE — Progress Notes (Signed)
Patient has appointment scheduled with Dr. Danne Harbor at [facility] in 09/02/21.

## 2021-07-26 ENCOUNTER — Telehealth: Payer: Self-pay | Admitting: Family Medicine

## 2021-07-26 ENCOUNTER — Ambulatory Visit: Payer: BC Managed Care – PPO | Admitting: Family Medicine

## 2021-07-26 NOTE — Telephone Encounter (Signed)
Pt was prescribed amoxicillin for 10 days twice a day. She however only received 14 pills. She would like to receive her additional pills in order to finish out her RX. She says she is still having symptoms. Also she lost 2 pills. So technically for her to finish out the full 10 days, she will need an additional 8 pills.

## 2021-07-27 ENCOUNTER — Other Ambulatory Visit: Payer: Self-pay | Admitting: Family Medicine

## 2021-07-27 DIAGNOSIS — J019 Acute sinusitis, unspecified: Secondary | ICD-10-CM

## 2021-07-27 NOTE — Telephone Encounter (Signed)
Spoke with Dr. Madilyn Fireman regarding prescription. Per Dr. Madilyn Fireman, patient was treated for acute sinusitis 7 days on medication is sufficient. No further tablets needed. Patiet advised of message and verbalized understanding.

## 2021-07-27 NOTE — Telephone Encounter (Signed)
Patient called and left a vm again in reference to this message. Per Patient please fax over the prescription to [facility] at [phone] so she is able to get the medication.

## 2021-08-26 ENCOUNTER — Encounter: Payer: Self-pay | Admitting: Family Medicine

## 2021-09-01 ENCOUNTER — Telehealth: Payer: Self-pay

## 2021-09-01 LAB — HM COLONOSCOPY

## 2021-09-01 NOTE — Telephone Encounter (Signed)
Patient called requesting to have her dx code changed for labs that were drawn on 04-29-2021. Patient stated the codes need to be changed for insurance to cover. Will forward to [place] to review.

## 2021-09-02 NOTE — Telephone Encounter (Signed)
Left message advising a new code was submitted. It will take 6 weeks to process.

## 2021-09-03 ENCOUNTER — Encounter: Payer: Self-pay | Admitting: Family Medicine

## 2021-09-08 NOTE — Telephone Encounter (Signed)
Letter entered but I cannot print from this computer. ?

## 2021-11-04 ENCOUNTER — Encounter: Payer: Self-pay | Admitting: Family Medicine

## 2021-11-05 NOTE — Telephone Encounter (Signed)
Called Kathleen Beck ans spoke to her. She has a lot of support right now.  She is planing on retiring from her job.  Offered our support and encouragement and to reach out if at any point she needs anything. ?

## 2022-01-03 LAB — HM MAMMOGRAPHY

## 2022-02-16 ENCOUNTER — Ambulatory Visit (INDEPENDENT_AMBULATORY_CARE_PROVIDER_SITE_OTHER): Payer: BC Managed Care – PPO | Admitting: Family Medicine

## 2022-02-16 DIAGNOSIS — Z111 Encounter for screening for respiratory tuberculosis: Secondary | ICD-10-CM | POA: Diagnosis not present

## 2022-02-16 NOTE — Progress Notes (Signed)
Agree with documentation as above.   Hidaya Daniel, MD  

## 2022-02-16 NOTE — Progress Notes (Signed)
   Established Patient Office Visit  Subjective   Patient ID: Kathleen Beck, female    DOB: Jun 22, 1961  Age: 61 y.o. MRN: [id number]  Chief Complaint  Patient presents with   PPD Placement    HPI  Kathleen Beck is here for TB screening.   ROS    Objective:     There were no vitals taken for this visit.   Physical Exam   No results found for any visits on 02/16/22.    The 10-year ASCVD risk score (Arnett DK, et al., 2019) is: 2.2%    Assessment & Plan:  PPD placement - Patient tolerated injection well without complications.   Problem List Items Addressed This Visit   None Visit Diagnoses     Screening-pulmonary TB    -  Primary   Relevant Orders   PPD (Completed)       Return in about 2 days (around 02/18/2022) for PPD reading. Earna Coder, Janalyn Harder, CMA

## 2022-02-18 ENCOUNTER — Ambulatory Visit (INDEPENDENT_AMBULATORY_CARE_PROVIDER_SITE_OTHER): Payer: BC Managed Care – PPO | Admitting: Family Medicine

## 2022-02-18 VITALS — BP 104/61 | HR 82

## 2022-02-18 DIAGNOSIS — Z111 Encounter for screening for respiratory tuberculosis: Secondary | ICD-10-CM

## 2022-02-18 LAB — TB SKIN TEST
Induration: 0 mm
TB Skin Test: NEGATIVE

## 2022-02-18 NOTE — Progress Notes (Signed)
Agree with documentation as above.   Deloras Reichard, MD  

## 2022-02-18 NOTE — Progress Notes (Signed)
   Established Patient Office Visit  Subjective   Patient ID: Kathleen Beck, female    DOB: 1960/10/13  Age: 61 y.o. MRN: [id number]  Chief Complaint  Patient presents with   PPD Reading    HPI  Kathleen Beck is here for PPD reading.   ROS    Objective:     BP 104/61   Pulse 82   SpO2 99%    Physical Exam   No results found for any visits on 02/18/22.    The 10-year ASCVD risk score (Arnett DK, et al., 2019) is: 1.6%    Assessment & Plan:  TB screening - PPD - negative 0 mm   Problem List Items Addressed This Visit   None Visit Diagnoses     Screening-pulmonary TB    -  Primary       No follow-ups on file.    Esmond Harps, CMA

## 2022-08-24 ENCOUNTER — Encounter: Payer: Self-pay | Admitting: Family Medicine

## 2022-11-08 ENCOUNTER — Ambulatory Visit: Payer: BC Managed Care – PPO | Admitting: Family Medicine

## 2022-11-08 VITALS — BP 95/63 | HR 84 | Wt 125.0 lb

## 2022-11-08 DIAGNOSIS — F419 Anxiety disorder, unspecified: Secondary | ICD-10-CM | POA: Diagnosis not present

## 2022-11-08 DIAGNOSIS — F4321 Adjustment disorder with depressed mood: Secondary | ICD-10-CM

## 2022-11-08 DIAGNOSIS — M79671 Pain in right foot: Secondary | ICD-10-CM | POA: Diagnosis not present

## 2022-11-08 MED ORDER — ESCITALOPRAM OXALATE 5 MG PO TABS: 5.0000 mg | ORAL_TABLET | Freq: Every day | ORAL | 1 refills | Status: DC

## 2022-11-08 NOTE — Progress Notes (Signed)
Acute Office Visit  Subjective:     Patient ID: Kathleen Beck, female    DOB: [birth year], 62 y.o.   MRN: [id number]  Chief Complaint  Patient presents with   foot pain    HPI Patient is in today for toe pain that started last June after a fall down some steps.  She had also suffered a head injury at that time and did go to the emergency department at [facility] on June 27.  She had a negative head CT but they did not evaluate her foot at that time.  The pain in her foot is mostly across the tops of her toes..  They ache and burn.  This pain a 5 out of 10 most days.  She still struggling with her grief.  Her husband died suddenly in a plane crash in April of last year.  She says it has been a really difficult year.  She has been having nightmares headaches and flares with her IBS.  She is involved in a spouse military support group which has been helpful and she has been occasionally working with a therapist specifically for grief but is looking for a new therapist.  Loss adjuster, chartered Office Visit from 11/08/2022 in [facility] at [facility]  PHQ-9 Total Score 9         11/08/2022   12:02 PM 04/08/2021    8:46 AM  GAD 7 : Generalized Anxiety Score  Nervous, Anxious, on Edge 3 3  Control/stop worrying 3 3  Worry too much - different things 1 1  Trouble relaxing 3 3  Restless 1 3  Easily annoyed or irritable 3 3  Afraid - awful might happen 3 1  Total GAD 7 Score 17 17  Anxiety Difficulty Somewhat difficult Somewhat difficult      ROS      Objective:    BP 95/63   Pulse 84   Wt 125 lb (56.7 kg)   SpO2 99%   BMI 22.86 kg/m    Physical Exam  No results found for any visits on 11/08/22.      Assessment & Plan:   Problem List Items Addressed This Visit       Other   Grief   Anxiety   Relevant Medications   escitalopram (LEXAPRO) 5 MG tablet   Other Visit Diagnoses     Right foot pain    -  Primary    Relevant Orders   DG Foot Complete Right       Grief/anxiety-we did discuss options.  I do think it is great that she is in a support group and she is try to get connected with a new therapist.  We also discussed medication as an option she is open to trying an SSRI.  Will start with 5 mg daily and have her follow-up in about 3 to 4 weeks.  Her PHQ-9 score of 9 today and GAD-7 score of 17.  Will monitor at follow-up.  Call if any problems or concerns.  Right foot pain-will start with plain film x-ray since the initial injury was a fall.  We can certainly see if maybe there is evidence of any old fracture injury.  Also consider some collapse of the distal arch which could be causing some persistent pain as well as possibly neuroma though the pain does radiate into all of her toes.  Meds ordered this encounter  Medications   escitalopram (LEXAPRO) 5 MG tablet  Sig: Take 1 tablet (5 mg total) by mouth daily.    Dispense:  30 tablet    Refill:  1    Return in about 23 days (around 12/01/2022) for New start medication.  Nani Gasser, MD

## 2022-11-09 ENCOUNTER — Ambulatory Visit (INDEPENDENT_AMBULATORY_CARE_PROVIDER_SITE_OTHER): Payer: Commercial Managed Care - HMO

## 2022-11-09 ENCOUNTER — Encounter: Payer: Self-pay | Admitting: Family Medicine

## 2022-11-09 DIAGNOSIS — F439 Reaction to severe stress, unspecified: Secondary | ICD-10-CM

## 2022-11-09 DIAGNOSIS — K582 Mixed irritable bowel syndrome: Secondary | ICD-10-CM

## 2022-11-09 DIAGNOSIS — M79671 Pain in right foot: Secondary | ICD-10-CM | POA: Diagnosis not present

## 2022-11-09 DIAGNOSIS — R5383 Other fatigue: Secondary | ICD-10-CM

## 2022-11-09 NOTE — Telephone Encounter (Signed)
Orders Placed This Encounter  Procedures   CBC with Differential/Platelet   COMPLETE METABOLIC PANEL WITH GFR   TSH   Fe+TIBC+Fer

## 2022-11-10 ENCOUNTER — Encounter: Payer: Self-pay | Admitting: Family Medicine

## 2022-11-11 LAB — CBC WITH DIFFERENTIAL/PLATELET
Eosinophils Relative: 2.6 %
Lymphs Abs: 1739 cells/uL (ref 850–3900)
MCHC: 32.2 g/dL (ref 32.0–36.0)
Monocytes Relative: 10.6 %
RBC: 4.73 10*6/uL (ref 3.80–5.10)

## 2022-11-12 LAB — CBC WITH DIFFERENTIAL/PLATELET
Absolute Monocytes: 488 cells/uL (ref 200–950)
Basophils Absolute: 51 cells/uL (ref 0–200)
Basophils Relative: 1.1 %
Eosinophils Absolute: 120 cells/uL (ref 15–500)
HCT: 41.3 % (ref 35.0–45.0)
Hemoglobin: 13.3 g/dL (ref 11.7–15.5)
MCH: 28.1 pg (ref 27.0–33.0)
MCV: 87.3 fL (ref 80.0–100.0)
MPV: 11.4 fL (ref 7.5–12.5)
Neutro Abs: 2203 cells/uL (ref 1500–7800)
Neutrophils Relative %: 47.9 %
Platelets: 206 10*3/uL (ref 140–400)
RDW: 11.9 % (ref 11.0–15.0)
Total Lymphocyte: 37.8 %
WBC: 4.6 10*3/uL (ref 3.8–10.8)

## 2022-11-12 LAB — IRON,TIBC AND FERRITIN PANEL
%SAT: 40 % (calc) (ref 16–45)
Ferritin: 15 ng/mL — ABNORMAL LOW (ref 16–232)
Iron: 131 ug/dL (ref 45–160)
TIBC: 325 mcg/dL (calc) (ref 250–450)

## 2022-11-12 LAB — COMPLETE METABOLIC PANEL WITH GFR
AG Ratio: 1.6 (calc) (ref 1.0–2.5)
ALT: 13 U/L (ref 6–29)
AST: 17 U/L (ref 10–35)
Albumin: 4.1 g/dL (ref 3.6–5.1)
Alkaline phosphatase (APISO): 58 U/L (ref 37–153)
BUN: 12 mg/dL (ref 7–25)
CO2: 31 mmol/L (ref 20–32)
Calcium: 9.7 mg/dL (ref 8.6–10.4)
Chloride: 103 mmol/L (ref 98–110)
Creat: 0.83 mg/dL (ref 0.50–1.05)
Globulin: 2.5 g/dL (calc) (ref 1.9–3.7)
Glucose, Bld: 89 mg/dL (ref 65–99)
Potassium: 4.4 mmol/L (ref 3.5–5.3)
Sodium: 140 mmol/L (ref 135–146)
Total Bilirubin: 0.4 mg/dL (ref 0.2–1.2)
Total Protein: 6.6 g/dL (ref 6.1–8.1)
eGFR: 81 mL/min/{1.73_m2} (ref >=60)

## 2022-11-12 LAB — TSH: TSH: 1.18 mIU/L (ref 0.40–4.50)

## 2022-11-15 ENCOUNTER — Encounter: Payer: Self-pay | Admitting: Family Medicine

## 2022-11-15 DIAGNOSIS — R790 Abnormal level of blood mineral: Secondary | ICD-10-CM | POA: Insufficient documentation

## 2022-11-15 NOTE — Progress Notes (Signed)
Hi Kathleen Beck, overall the x-ray of your foot looks good.  You do have a small bone spur near your big toe at the head of the long bone.  This could be contributing to some of your pain though I know the pain is mostly across the distal foot.  I would really like to fit you with some pads to put in your shoes to support those metatarsal heads and see if this provides some pain relief.  And if that is not helping then we could always get you in with podiatry.  We could always schedule you for a nurse appointment to get those fitted for you.  You can bring a pair shoes that you typically wear.

## 2022-11-15 NOTE — Progress Notes (Signed)
Kathleen Beck, your total iron looks good but your iron stores are little on the low side years or 15 and we like that to be above 40 especially if you are exercising.  So I encourage you to try to increase your iron intake with foods that are iron rich or you could even consider taking an iron supplement over-the-counter.  Sometimes iron can be a little constipating so you just need to keep an eye out for that.  Lets plan to recheck ferritin in about 12 weeks.  All other labs look great.

## 2022-11-23 ENCOUNTER — Ambulatory Visit: Payer: BC Managed Care – PPO

## 2022-12-01 ENCOUNTER — Ambulatory Visit: Payer: BC Managed Care – PPO | Admitting: Family Medicine

## 2022-12-13 ENCOUNTER — Telehealth: Payer: Self-pay | Admitting: Nurse Practitioner

## 2022-12-13 ENCOUNTER — Encounter: Payer: Self-pay | Admitting: Family Medicine

## 2022-12-13 ENCOUNTER — Ambulatory Visit (INDEPENDENT_AMBULATORY_CARE_PROVIDER_SITE_OTHER): Payer: Commercial Managed Care - HMO | Admitting: Family Medicine

## 2022-12-13 DIAGNOSIS — M79671 Pain in right foot: Secondary | ICD-10-CM | POA: Diagnosis not present

## 2022-12-13 DIAGNOSIS — G4700 Insomnia, unspecified: Secondary | ICD-10-CM | POA: Diagnosis not present

## 2022-12-13 MED ORDER — MELOXICAM 15 MG PO TABS
15.0000 mg | ORAL_TABLET | Freq: Every day | ORAL | 0 refills | Status: DC | PRN
Start: 2022-12-13 — End: 2024-01-01

## 2022-12-13 NOTE — Telephone Encounter (Signed)
Pt was seen today.

## 2022-12-13 NOTE — Progress Notes (Signed)
   Acute Office Visit  Subjective:     Patient ID: Kathleen Beck, female    DOB: 10/02/1960, 62 y.o.   MRN: [id number]  Chief Complaint  Patient presents with   Foot Pain    HPI Patient is in today for right foot pain-when I saw her about a month ago she was having pain over the distal part of the foot.  She came in today because she was having more intense pain over that medial great toe on the right foot.  She was planning for a widows military hike later this weekend and bought some new hiking shoes..  She wore them yesterday to try to break the man and last night started having intense pain in her foot to the point that it was actually throbbing and keeping her awake last night.  She finally took some ibuprofen and was able to fall asleep but it still painful today.  She has been a lot more physically active doing yard work.    ROS      Objective:    There were no vitals taken for this visit.   Physical Exam Musculoskeletal:     Comments: Great toe is normal in appearance no swelling erythema or heat to touch.  Normal range of motion of the toe joints.  Nontender over the metatarsal heads.  No sign of ingrown nail.     No results found for any visits on 12/13/22.      Assessment & Plan:   Problem List Items Addressed This Visit       Other   INSOMNIA, CHRONIC   Other Visit Diagnoses     Right foot pain    -  Primary   Relevant Medications   meloxicam (MOBIC) 15 MG tablet   Other Relevant Orders   Ambulatory referral to Podiatry      Foot pain over the medial right great toe-I do think it was exacerbated by her shoe wear yesterday.  We had previously done an x-ray about a month ago which was reassuring there was some bone spurring and arthritis but it was mild.  We discussed referral to podiatry for further evaluation and also gave her some scaphoid pads to put in her sneakers.  I do think this will provide some extra support but it will not completely  alleviate her pain.  We did discuss starting an anti-inflammatory for the next 5 to 7 days to provide some pain relief to elevate and ice as needed.  Insomnia-she is still really struggling with sleep since the passing of her husband she has some doxepin at home.  I did discuss that I am happy to try a different medication and if an 80 point she would like to do that I am happy to send that over.  Meds ordered this encounter  Medications   meloxicam (MOBIC) 15 MG tablet    Sig: Take 1 tablet (15 mg total) by mouth daily as needed for pain.    Dispense:  30 tablet    Refill:  0    No follow-ups on file.  Nani Gasser, MD

## 2022-12-20 LAB — HM MAMMOGRAPHY

## 2022-12-22 ENCOUNTER — Ambulatory Visit: Payer: Commercial Managed Care - HMO | Admitting: Podiatry

## 2022-12-27 ENCOUNTER — Encounter: Payer: Self-pay | Admitting: Family Medicine

## 2023-01-06 ENCOUNTER — Ambulatory Visit: Payer: Commercial Managed Care - HMO | Admitting: Podiatry

## 2023-01-26 ENCOUNTER — Ambulatory Visit: Payer: Commercial Managed Care - HMO | Admitting: Podiatry

## 2023-01-26 ENCOUNTER — Telehealth: Payer: Self-pay | Admitting: Family Medicine

## 2023-01-26 ENCOUNTER — Encounter: Payer: Self-pay | Admitting: Podiatry

## 2023-01-26 ENCOUNTER — Encounter: Payer: Self-pay | Admitting: *Deleted

## 2023-01-26 DIAGNOSIS — G5791 Unspecified mononeuropathy of right lower limb: Secondary | ICD-10-CM

## 2023-01-26 NOTE — Progress Notes (Signed)
  Subjective:  Patient ID: Kathleen Beck, female    DOB: 1961/03/13,   MRN: [id number]  Chief Complaint  Patient presents with   Foot Pain    Right foot pain to the top of toes. Throbbing sensation occasionally. Patient is not diabetic.     62 y.o. female presents for concern of right foot pain that has been going on for about a year. Relates the pain does come and go and starts in her big toe and is in her second and third maybe fourth as well. Relates she has not had the pain since about May but wanted to make sure she was doing ok and would be able to avoid any pain in the future. Relates the pain sometimes causes a burn but does not spread up the leg or foot. Uses tiger balm and ibuprofen and that helps. Relates last time this happened after a long hike and she was not in her normal good shoes . Denies any other pedal complaints. Denies n/v/f/c.   No past medical history on file.  Objective:  Physical Exam: Vascular: DP/PT pulses 2/4 bilateral. CFT <3 seconds. Normal hair growth on digits. No edema.  Skin. No lacerations or abrasions bilateral feet.  Musculoskeletal: MMT 5/5 bilateral lower extremities in DF, PF, Inversion and Eversion. Deceased ROM in DF of ankle joint. No tenderness or pain to palpation today. Negative tinels signs. No obvious changes in the foot. Rectus position and neutral arch.,  Neurological: Sensation intact to light touch.   Assessment:   1. Neuritis of right foot      Plan:  Patient was evaluated and treated and all questions answered. Discussed possible irritation of the nerve that could have caused the pain and etiology as well as treatment with patient.  Radiographs reviewed and discussed with patient. No fractures or dislocations noted. There is some early degenerative changes noted in the first MPJ.  -Discussed and educated patient on foot care.  -Discussed supportive shoes at all times and checking feet regularly.  -Discussed options for inserts  custom vs OTC and patient will consider.  -Discussed if pain continues could consider NCV in the future for further evaluation but will continue to monitor at this time.  -Patient to return as needed   Louann Sjogren, DPM

## 2023-01-26 NOTE — Telephone Encounter (Signed)
Patient came in to ask what the name of the shoe inserts are and the website or phone number for them.   Patient requested a Mychart message with this information is sent to her.

## 2023-02-02 ENCOUNTER — Telehealth: Payer: Self-pay | Admitting: Family Medicine

## 2023-02-02 DIAGNOSIS — E611 Iron deficiency: Secondary | ICD-10-CM

## 2023-02-02 NOTE — Telephone Encounter (Signed)
Labs ordered.

## 2023-02-02 NOTE — Telephone Encounter (Signed)
Patient called she is requesting to have lab work for Iron levels checked she is due in August

## 2023-09-28 ENCOUNTER — Telehealth: Payer: Self-pay

## 2023-09-28 NOTE — Telephone Encounter (Signed)
 I called and left am message for patient to call back. She is due for a cologuard.

## 2023-11-08 ENCOUNTER — Encounter: Payer: Self-pay | Admitting: Family Medicine

## 2023-11-08 ENCOUNTER — Telehealth: Payer: Self-pay | Admitting: Family Medicine

## 2023-11-08 NOTE — Telephone Encounter (Signed)
 Copied from CRM [id number]. Topic: General - Other >> Nov 08, 2023  2:51 PM Carrielelia G wrote: Reason for CRM: Patent Sasseen would like to ask Dr Greer Leak if she would take her mother on as a new patient. Her mother is 35 and in pretty good health. She will be relocating from [place], [place] .  Please advise

## 2023-11-08 NOTE — Telephone Encounter (Signed)
Yes I will be happy to see her  

## 2023-11-09 NOTE — Telephone Encounter (Signed)
 They should be reaching out to schedule.

## 2023-12-25 ENCOUNTER — Ambulatory Visit: Payer: Self-pay

## 2023-12-25 ENCOUNTER — Telehealth: Payer: Self-pay | Admitting: Family Medicine

## 2023-12-25 NOTE — Telephone Encounter (Signed)
     Patient has a cough and cold and is concerned about going to visit her because she doesn't know if she is contagious right now. Patient is requesting Lynnie Saucier to call her back. Call back # [phone]

## 2023-12-25 NOTE — Telephone Encounter (Signed)
 Pt asking for education on illness/contagiousness. Questions answered.

## 2023-12-25 NOTE — Telephone Encounter (Signed)
 Copied from CRM [id number]. Topic: Clinical - Pink Word Triage >> Dec 25, 2023  5:33 PM Kathleen Beck wrote: Patient has a cough and cold and is concerned about going to visit her because she doesn't know if she is contagious right now. Patient is requesting Lynnie Saucier to call her back. Call back # [phone]

## 2023-12-26 NOTE — Telephone Encounter (Signed)
 Pt returned the call. States she has had a cold since 6/11. Has had a cough, no fever.  Pt wanted to know if there were any OTC meds that she could take to help with her cough so told pt she could try either delsym or robitussin. Also stated to pt if she had mucus that she could not get up that she could try mucinex to see if that would help loosen the phlegm.  Told pt that we might need to get her in for an appt since her symptoms have been going on for the past 6 days. Tried scheduling her for an appt tomorrow 6/18 with Jade since she has seen her before but the same time I was trying to schedule her, someone else took that slot prior to me able to get pt scheduled.  Pt wants to know if any recommendations could be given to her and if anything might be able to be sent in for her without her having an appt or if she really should be seen.  Dr. Greer Leak, please advise.

## 2023-12-26 NOTE — Telephone Encounter (Signed)
Attempted to call pt but unable to reach. Left message to return call.  

## 2023-12-27 ENCOUNTER — Ambulatory Visit: Admitting: Physician Assistant

## 2023-12-28 NOTE — Telephone Encounter (Signed)
 Agree with recommendations below. Also could add flonase or nasaonex if some of this is allergies.

## 2024-01-01 ENCOUNTER — Encounter: Payer: Self-pay | Admitting: Family Medicine

## 2024-01-01 ENCOUNTER — Ambulatory Visit: Admitting: Family Medicine

## 2024-01-01 VITALS — BP 120/70 | HR 88 | Ht 62.0 in | Wt 116.0 lb

## 2024-01-01 DIAGNOSIS — J40 Bronchitis, not specified as acute or chronic: Secondary | ICD-10-CM | POA: Diagnosis not present

## 2024-01-01 MED ORDER — BENZONATATE 200 MG PO CAPS: 200.0000 mg | ORAL_CAPSULE | Freq: Two times a day (BID) | ORAL | 0 refills | Status: AC | PRN

## 2024-01-01 MED ORDER — PREDNISONE 20 MG PO TABS: 40.0000 mg | ORAL_TABLET | Freq: Every day | ORAL | 0 refills | Status: AC

## 2024-01-01 NOTE — Telephone Encounter (Signed)
 Pt is scheduled for OV today. Will discuss further if needed.

## 2024-01-01 NOTE — Progress Notes (Signed)
   Acute Office Visit  Subjective:     Patient ID: Kathleen Beck, female    DOB: May 25, 1961, 63 y.o.   MRN: [id number]  Chief Complaint  Patient presents with   Cough    HPI Patient is in today for cough x 2 weeks. No fever, sweats or chills.    Somc congestion and ST. Voice coming and going.  Cough occ productive  ROS      Objective:    BP 120/70   Pulse 88   Ht 5' 2 (1.575 m)   Wt 116 lb (52.6 kg)   SpO2 100%   BMI 21.22 kg/m    Physical Exam Constitutional:      Appearance: Normal appearance.  HENT:     Head: Normocephalic and atraumatic.     Right Ear: Tympanic membrane, ear canal and external ear normal. There is no impacted cerumen.     Left Ear: Tympanic membrane, ear canal and external ear normal. There is no impacted cerumen.     Nose: Nose normal.     Mouth/Throat:     Pharynx: Oropharynx is clear.   Eyes:     Conjunctiva/sclera: Conjunctivae normal.    Cardiovascular:     Rate and Rhythm: Normal rate and regular rhythm.  Pulmonary:     Effort: Pulmonary effort is normal.     Breath sounds: Normal breath sounds.   Musculoskeletal:     Cervical back: Neck supple. No tenderness.  Lymphadenopathy:     Cervical: No cervical adenopathy.   Skin:    General: Skin is warm and dry.   Neurological:     Mental Status: She is alert and oriented to person, place, and time.   Psychiatric:        Mood and Affect: Mood normal.     No results found for any visits on 01/01/24.      Assessment & Plan:   Problem List Items Addressed This Visit   None Visit Diagnoses       Bronchitis    -  Primary      Likely viral with post viral cough.  - will tx with prednisone. Call by end of weeke if not stating to feel better.  Continue hydration and symptomatic care.   Meds ordered this encounter  Medications   predniSONE (DELTASONE) 20 MG tablet    Sig: Take 2 tablets (40 mg total) by mouth daily with breakfast.    Dispense:  10 tablet    Refill:   0   benzonatate (TESSALON) 200 MG capsule    Sig: Take 1 capsule (200 mg total) by mouth 2 (two) times daily as needed for cough.    Dispense:  30 capsule    Refill:  0    No follow-ups on file.  Dorothyann Byars, MD

## 2024-06-10 ENCOUNTER — Encounter: Payer: Self-pay | Admitting: Family Medicine

## 2024-06-28 ENCOUNTER — Encounter: Payer: Self-pay | Admitting: Family Medicine

## 2024-07-02 ENCOUNTER — Encounter: Payer: Self-pay | Admitting: Family Medicine

## 2024-07-02 NOTE — Telephone Encounter (Signed)
 Copied from CRM #[id number]. Topic: General - Other >> Jul 02, 2024 11:27 AM Jasmin G wrote: Reason for CRM: Pt requested a call back at [phone] from Ms. Metheney's team to discuss recent issues about a respiratory issue exposure.

## 2024-07-05 ENCOUNTER — Telehealth: Admitting: Family Medicine

## 2024-07-05 ENCOUNTER — Encounter

## 2024-07-05 DIAGNOSIS — J029 Acute pharyngitis, unspecified: Secondary | ICD-10-CM

## 2024-07-05 MED ORDER — AMOXICILLIN 875 MG PO TABS: 875.0000 mg | ORAL_TABLET | Freq: Two times a day (BID) | ORAL | 0 refills | Status: AC

## 2024-07-05 NOTE — Progress Notes (Signed)
